# Patient Record
Sex: Female | Born: 1985 | Race: Black or African American | Hispanic: No | Marital: Single | Smoking: Never smoker
Health system: Southern US, Community
[De-identification: ages and names within clinical notes are randomized; demographics above are authoritative.]

## PROBLEM LIST (undated history)

## (undated) ENCOUNTER — Inpatient Hospital Stay (HOSPITAL_COMMUNITY): Payer: Self-pay

## (undated) DIAGNOSIS — K219 Gastro-esophageal reflux disease without esophagitis: Secondary | ICD-10-CM

## (undated) DIAGNOSIS — Z01419 Encounter for gynecological examination (general) (routine) without abnormal findings: Secondary | ICD-10-CM

## (undated) DIAGNOSIS — E282 Polycystic ovarian syndrome: Secondary | ICD-10-CM

## (undated) DIAGNOSIS — R1310 Dysphagia, unspecified: Secondary | ICD-10-CM

## (undated) DIAGNOSIS — A6000 Herpesviral infection of urogenital system, unspecified: Secondary | ICD-10-CM

## (undated) DIAGNOSIS — Z86718 Personal history of other venous thrombosis and embolism: Secondary | ICD-10-CM

## (undated) DIAGNOSIS — I10 Essential (primary) hypertension: Secondary | ICD-10-CM

## (undated) DIAGNOSIS — J302 Other seasonal allergic rhinitis: Secondary | ICD-10-CM

## (undated) DIAGNOSIS — B379 Candidiasis, unspecified: Secondary | ICD-10-CM

## (undated) DIAGNOSIS — A599 Trichomoniasis, unspecified: Secondary | ICD-10-CM

## (undated) DIAGNOSIS — N390 Urinary tract infection, site not specified: Secondary | ICD-10-CM

## (undated) HISTORY — DX: Encounter for gynecological examination (general) (routine) without abnormal findings: Z01.419

## (undated) HISTORY — DX: Candidiasis, unspecified: B37.9

## (undated) HISTORY — DX: Personal history of other venous thrombosis and embolism: Z86.718

## (undated) HISTORY — DX: Other seasonal allergic rhinitis: J30.2

## (undated) HISTORY — DX: Essential (primary) hypertension: I10

## (undated) HISTORY — DX: Herpesviral infection of urogenital system, unspecified: A60.00

## (undated) HISTORY — DX: Trichomoniasis, unspecified: A59.9

## (undated) HISTORY — DX: Gastro-esophageal reflux disease without esophagitis: K21.9

## (undated) HISTORY — DX: Dysphagia, unspecified: R13.10

## (undated) HISTORY — DX: Polycystic ovarian syndrome: E28.2

## (undated) HISTORY — PX: TONSILECTOMY, ADENOIDECTOMY, BILATERAL MYRINGOTOMY AND TUBES: SHX2538

---
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
## 2006-03-06 ENCOUNTER — Emergency Department (HOSPITAL_COMMUNITY): Admission: EM | Admit: 2006-03-06 | Discharge: 2006-03-06 | Payer: Self-pay | Admitting: Emergency Medicine

## 2006-11-30 ENCOUNTER — Emergency Department (HOSPITAL_COMMUNITY): Admission: EM | Admit: 2006-11-30 | Discharge: 2006-11-30 | Payer: Self-pay | Admitting: Emergency Medicine

## 2008-08-12 DIAGNOSIS — R1310 Dysphagia, unspecified: Secondary | ICD-10-CM

## 2008-08-12 HISTORY — DX: Dysphagia, unspecified: R13.10

## 2009-08-21 ENCOUNTER — Emergency Department (HOSPITAL_COMMUNITY): Admission: EM | Admit: 2009-08-21 | Discharge: 2009-08-21 | Payer: Self-pay | Admitting: Emergency Medicine

## 2012-01-08 ENCOUNTER — Other Ambulatory Visit: Payer: Self-pay | Admitting: Obstetrics and Gynecology

## 2012-01-08 ENCOUNTER — Ambulatory Visit (INDEPENDENT_AMBULATORY_CARE_PROVIDER_SITE_OTHER): Payer: BC Managed Care – PPO | Admitting: Obstetrics and Gynecology

## 2012-01-08 ENCOUNTER — Encounter: Payer: Self-pay | Admitting: Obstetrics and Gynecology

## 2012-01-08 VITALS — BP 122/66 | Ht 62.0 in | Wt 193.0 lb

## 2012-01-08 DIAGNOSIS — A499 Bacterial infection, unspecified: Secondary | ICD-10-CM

## 2012-01-08 DIAGNOSIS — B9689 Other specified bacterial agents as the cause of diseases classified elsewhere: Secondary | ICD-10-CM

## 2012-01-08 DIAGNOSIS — Z124 Encounter for screening for malignant neoplasm of cervix: Secondary | ICD-10-CM

## 2012-01-08 DIAGNOSIS — Z113 Encounter for screening for infections with a predominantly sexual mode of transmission: Secondary | ICD-10-CM

## 2012-01-08 DIAGNOSIS — Z9189 Other specified personal risk factors, not elsewhere classified: Secondary | ICD-10-CM

## 2012-01-08 DIAGNOSIS — Z202 Contact with and (suspected) exposure to infections with a predominantly sexual mode of transmission: Secondary | ICD-10-CM

## 2012-01-08 DIAGNOSIS — N760 Acute vaginitis: Secondary | ICD-10-CM

## 2012-01-08 DIAGNOSIS — Z01419 Encounter for gynecological examination (general) (routine) without abnormal findings: Secondary | ICD-10-CM

## 2012-01-08 DIAGNOSIS — E282 Polycystic ovarian syndrome: Secondary | ICD-10-CM

## 2012-01-08 LAB — POCT OSOM TRICHOMONAS RAPID TEST: Trichomonas vaginalis: NEGATIVE

## 2012-01-08 MED ORDER — ETONOGESTREL-ETHINYL ESTRADIOL 0.12-0.015 MG/24HR VA RING: VAGINAL_RING | VAGINAL | Status: DC

## 2012-01-08 MED ORDER — METRONIDAZOLE 0.75 % VA GEL: VAGINAL | Status: DC

## 2012-01-08 NOTE — Progress Notes (Signed)
The patient reports:she complains of vaginal odor. Also has PCOS wanting information on having children.   Contraception:condoms  Last mammogram: not applicable Last pap: approximate date 09/2010 and was normal   GC/Chlamydia cultures offered: requested HIV/RPR/HbsAg offered:  requested HSV 1 and 2 glycoprotein offered: requested  Menstrual cycle regular and monthly: Yes Menstrual flow normal: No: varies. Sometimes very heavy and painful, others light with no pain  Urinary symptoms: some burning. Requested Urine Check Normal bowel movements: Yes Reports abuse at home: No:  Subjective:    Allison Wagner is a 26 y.o. female, G0P0, who presents for an annual exam with history of PCOS, massive weight gain over the las 2 years, and concerns around fertility   History   Social History  . Marital Status: Single    Spouse Name: N/A    Number of Children: N/A  . Years of Education: N/A   Social History Main Topics  . Smoking status: Never Smoker   . Smokeless tobacco: Never Used  . Alcohol Use: Yes     occasional   . Drug Use: No  . Sexually Active: Yes    Birth Control/ Protection: Condom   Other Topics Concern  . None   Social History Narrative  . None    Menstrual cycle:   LMP: Patient's last menstrual period was 01/04/2012.           Cycle: Now fairly regular, monthly, lasting 5 days, with occassional premenstrual spotting for 2-3 days, and sever cramps  The following portions of the patient's history were reviewed and updated as appropriate: allergies, current medications, past family history, past medical history, past social history, past surgical history and problem list.  Review of Systems Pertinent items are noted in HPI. Breast:Negative for breast lump,nipple discharge or nipple retraction Gastrointestinal: Negative for abdominal pain, change in bowel habits or rectal bleeding Urinary:negative   Objective:    BP 122/66  Ht 5\' 2"  (1.575 m)  Wt 193 lb  (87.544 kg)  BMI 35.30 kg/m2  LMP 01/04/2012    Weight:  Wt Readings from Last 1 Encounters:  01/08/12 193 lb (87.544 kg)          BMI: Body mass index is 35.30 kg/(m^2).  General Appearance: Alert, appropriate appearance for age. No acute distress HEENT: Grossly normal Neck / Thyroid: Supple, no masses, nodes or enlargement Lungs: clear to auscultation bilaterally Back: No CVA tenderness Breast Exam: No masses or nodes.No dimpling, nipple retraction or discharge. Cardiovascular: Regular rate and rhythm. S1, S2, no murmur Gastrointestinal: Soft, non-tender, no masses or organomegaly Pelvic Exam: External genitalia: normal general appearance Vaginal: normal mucosa without prolapse or lesions Cervix: normal appearance Adnexa: non palpable Uterus: doesnt feel enlarged, but exam is compromised by patient habitus Rectovaginal: normal rectal, no masses Lymphatic Exam: Non-palpable nodes in neck, clavicular, axillary, or inguinal regions Skin: no rash or abnormalities Neurologic: Normal gait and speech, no tremor  Psychiatric: Alert and oriented, appropriate affect.  OSOM: BV: POSITIVE   TRICH: NEGATIVE     Assessment:    PCOS  BV Obesity   Plan:  Metrog[employer]  counseled on breast self exam, STD prevention, use and side effects of OCP's and family planning choices additional lab tests per orders:   Nutrition counseling offered and declined Exercise recommended for weight management Fertility issues discussed STD screening: done, written information provided Contraception:NuvaRing vaginal inserts reviewed for benefits for PCOS and use.  Pt inserted a sample at visit and was instructed in its use.  FU in 3mos   Allison Wagner PMD

## 2012-01-08 NOTE — Patient Instructions (Addendum)
Polycystic Ovarian Syndrome Polycystic ovarian syndrome is a condition with a number of problems. One problem is with the ovaries. The ovaries are organs located in the female pelvis, on each side of the uterus. Usually, during the menstrual cycle, an egg is released from 1 ovary every month. This is called ovulation. When the egg is fertilized, it goes into the womb (uterus), which allows for the growth of a baby. The egg travels from the ovary through the fallopian tube to the uterus. The ovaries also make the hormones estrogen and progesterone. These hormones help the development of a woman's breasts, body shape, and body hair. They also regulate the menstrual cycle and pregnancy. Sometimes, cysts form in the ovaries. A cyst is a fluid-filled sac. On the ovary, different types of cysts can form. The most common type of ovarian cyst is called a functional or ovulation cyst. It is normal, and often forms during the normal menstrual cycle. Each month, a woman's ovaries grow tiny cysts that hold the eggs. When an egg is fully grown, the sac breaks open. This releases the egg. Then, the sac which released the egg from the ovary dissolves. In one type of functional cyst, called a follicle cyst, the sac does not break open to release the egg. It may actually continue to grow. This type of cyst usually disappears within 1 to 3 months.  One type of cyst problem with the ovaries is called Polycystic Ovarian Syndrome (PCOS). In this condition, many follicle cysts form, but do not rupture and produce an egg. This health problem can affect the following:  Menstrual cycle.   Heart.   Obesity.   Cancer of the uterus.   Fertility.   Blood vessels.   Hair growth (face and body) or baldness.   Hormones.   Appearance.   High blood pressure.   Stroke.   Insulin production.   Inflammation of the liver.   Elevated blood cholesterol and triglycerides.  CAUSES   No one knows the exact cause of PCOS.    Women with PCOS often have a mother or sister with PCOS. There is not yet enough proof to say this is inherited.   Many women with PCOS have a weight problem.   Researchers are looking at the relationship between PCOS and the body's ability to make insulin. Insulin is a hormone that regulates the change of sugar, starches, and other food into energy for the body's use, or for storage. Some women with PCOS make too much insulin. It is possible that the ovaries react by making too many female hormones, called androgens. This can lead to acne, excessive hair growth, weight gain, and ovulation problems.   Too much production of luteinizing hormone (LH) from the pituitary gland in the brain stimulates the ovary to produce too much female hormone (androgen).  SYMPTOMS   Infrequent or no menstrual periods, and/or irregular bleeding.   Inability to get pregnant (infertility), because of not ovulating.   Increased growth of hair on the face, chest, stomach, back, thumbs, thighs, or toes.   Acne, oily skin, or dandruff.   Pelvic pain.   Weight gain or obesity, usually carrying extra weight around the waist.   Type 2 diabetes (this is the diabetes that usually does not need insulin).   High cholesterol.   High blood pressure.   Female-pattern baldness or thinning hair.   Patches of thickened and dark brown or black skin on the neck, arms, breasts, or thighs.   Skin tags,   or tiny excess flaps of skin, in the armpits or neck area.   Sleep apnea (excessive snoring and breathing stops at times while asleep).   Deepening of the voice.   Gestational diabetes when pregnant.   Increased risk of miscarriage with pregnancy.  DIAGNOSIS  There is no single test to diagnose PCOS.   Your caregiver will:   Take a medical history.   Perform a pelvic exam.   Perform an ultrasound.   Check your female and female hormone levels.   Measure glucose or sugar levels in the blood.   Do other blood  tests.   If you are producing too many female hormones, your caregiver will make sure it is from PCOS. At the physical exam, your caregiver will want to evaluate the areas of increased hair growth. Try to allow natural hair growth for a few days before the visit.   During a pelvic exam, the ovaries may be enlarged or swollen by the increased number of small cysts. This can be seen more easily by vaginal ultrasound or screening, to examine the ovaries and lining of the uterus (endometrium) for cysts. The uterine lining may become thicker, if there has not been a regular period.  TREATMENT  Because there is no cure for PCOS, it needs to be managed to prevent problems. Treatments are based on your symptoms. Treatment is also based on whether you want to have a baby or whether you need contraception.  Treatment may include:  Progesterone hormone, to start a menstrual period.   Birth control pills, to make you have regular menstrual periods.   Medicines to make you ovulate, if you want to get pregnant.   Medicines to control your insulin.   Medicine to control your blood pressure.   Medicine and diet, to control your high cholesterol and triglycerides in your blood.   Surgery, making small holes in the ovary, to decrease the amount of female hormone production. This is done through a long, lighted tube (laparoscope), placed into the pelvis through a tiny incision in the lower abdomen.  Your caregiver will go over some of the choices with you. WOMEN WITH PCOS HAVE THESE CHARACTERISTICS:  High levels of female hormones called androgens.   An irregular or no menstrual cycle.   May have many small cysts in their ovaries.  PCOS is the most common hormonal reproductive problem in women of childbearing age. WHY DO WOMEN WITH PCOS HAVE TROUBLE WITH THEIR MENSTRUAL CYCLE? Each month, about 20 eggs start to mature in the ovaries. As one egg grows and matures, the follicle breaks open to release the egg,  so it can travel through the fallopian tube for fertilization. When the single egg leaves the follicle, ovulation takes place. In women with PCOS, the ovary does not make all of the hormones it needs for any of the eggs to fully mature. They may start to grow and accumulate fluid, but no one egg becomes large enough. Instead, some may remain as cysts. Since no egg matures or is released, ovulation does not occur and the hormone progesterone is not made. Without progesterone, a woman's menstrual cycle is irregular or absent. Also, the cysts produce female hormones, which continue to prevent ovulation.  Document Released: 11/22/2004 Document Revised: 07/18/2011 Document Reviewed: 06/16/2009 [facility] Patient Information 2012 [place], [place].  Bacterial Vaginosis Bacterial vaginosis (BV) is a vaginal infection where the normal balance of bacteria in the vagina is disrupted. The normal balance is then replaced by an overgrowth of  certain bacteria. There are several different kinds of bacteria that can cause BV. BV is the most common vaginal infection in women of childbearing age. CAUSES   The cause of BV is not fully understood. BV develops when there is an increase or imbalance of harmful bacteria.   Some activities or behaviors can upset the normal balance of bacteria in the vagina and put women at increased risk including:   Having a new sex partner or multiple sex partners.   Douching.   Using an intrauterine device (IUD) for contraception.   It is not clear what role sexual activity plays in the development of BV. However, women that have never had sexual intercourse are rarely infected with BV.  Women do not get BV from toilet seats, bedding, swimming pools or from touching objects around them.  SYMPTOMS   Grey vaginal discharge.   A fish-like odor with discharge, especially after sexual intercourse.   Itching or burning of the vagina and vulva.   Burning or pain with urination.   Some  women have no signs or symptoms at all.  DIAGNOSIS  Your caregiver must examine the vagina for signs of BV. Your caregiver will perform lab tests and look at the sample of vaginal fluid through a microscope. They will look for bacteria and abnormal cells (clue cells), a pH test higher than 4.5, and a positive amine test all associated with BV.  RISKS AND COMPLICATIONS   Pelvic inflammatory disease (PID).   Infections following gynecology surgery.   Developing HIV.   Developing herpes virus.  TREATMENT  Sometimes BV will clear up without treatment. However, all women with symptoms of BV should be treated to avoid complications, especially if gynecology surgery is planned. Female partners generally do not need to be treated. However, BV may spread between female sex partners so treatment is helpful in preventing a recurrence of BV.   BV may be treated with antibiotics. The antibiotics come in either pill or vaginal cream forms. Either can be used with nonpregnant or pregnant women, but the recommended dosages differ. These antibiotics are not harmful to the baby.   BV can recur after treatment. If this happens, a second round of antibiotics will often be prescribed.   Treatment is important for pregnant women. If not treated, BV can cause a premature delivery, especially for a pregnant woman who had a premature birth in the past. All pregnant women who have symptoms of BV should be checked and treated.   For chronic reoccurrence of BV, treatment with a type of prescribed gel vaginally twice a week is helpful.  HOME CARE INSTRUCTIONS   Finish all medication as directed by your caregiver.   Do not have sex until treatment is completed.   Tell your sexual partner that you have a vaginal infection. They should see their caregiver and be treated if they have problems, such as a mild rash or itching.   Practice safe sex. Use condoms. Only have 1 sex partner.  PREVENTION  Basic prevention steps  can help reduce the risk of upsetting the natural balance of bacteria in the vagina and developing BV:  Do not have sexual intercourse (be abstinent).   Do not douche.   Use all of the medicine prescribed for treatment of BV, even if the signs and symptoms go away.   Tell your sex partner if you have BV. That way, they can be treated, if needed, to prevent reoccurrence.  SEEK MEDICAL CARE IF:   Your  symptoms are not improving after 3 days of treatment.   You have increased discharge, pain, or fever.  MAKE SURE YOU:   Understand these instructions.   Will watch your condition.   Will get help right away if you are not doing well or get worse.  FOR MORE INFORMATION  Division of STD Prevention (DSTDP), Centers for Disease Control and Prevention: [employer] American Social Health Association (ASHA): www.ashastd.org  Document Released: 07/29/2005 Document Revised: 07/18/2011 Document Reviewed: 01/19/2009 [facility] Patient Information 2012 [place], [place].

## 2012-01-09 LAB — GC/CHLAMYDIA PROBE AMP, GENITAL
Chlamydia, DNA Probe: NEGATIVE
GC Probe Amp, Genital: NEGATIVE

## 2012-01-09 LAB — RPR

## 2012-01-14 ENCOUNTER — Telehealth: Payer: Self-pay

## 2012-01-14 NOTE — Telephone Encounter (Signed)
Tc from pt. 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 Results explained to pt in detail. Offered pt an appt to discuss if needed and pt opts to call back if appt needed. Pt voices understanding.

## 2012-01-14 NOTE — Telephone Encounter (Signed)
Lm on vm to cb per test results.  

## 2012-04-17 ENCOUNTER — Other Ambulatory Visit: Payer: Self-pay | Admitting: Obstetrics and Gynecology

## 2012-04-17 NOTE — Telephone Encounter (Signed)
Chandra/rx req.

## 2012-04-17 NOTE — Telephone Encounter (Signed)
Tc to pt per telephone call. Pt states,"needs rf for Nuvaring". Informed pt Nuvaring e-pres to pharm on file in 12/2011. Pt states," was unaware rx was called into pharm". Pt removed last Nuvaring on 03/28/12 and has a cycle on 04/03/12. Pt denies unprotected IC since last cycle. Informed pt to await menses for this month and take a pregnancy test. If negative, pt to insert ring on that day. Informed pt to use BUM of contraception x 1 month after ring insertion. Pt voices understanding. FU appt for birth control sched 05/18/12 with vph. Pt voices understanding.

## 2012-05-18 ENCOUNTER — Ambulatory Visit (INDEPENDENT_AMBULATORY_CARE_PROVIDER_SITE_OTHER): Payer: BC Managed Care – PPO | Admitting: Obstetrics and Gynecology

## 2012-05-18 ENCOUNTER — Encounter: Payer: Self-pay | Admitting: Obstetrics and Gynecology

## 2012-05-18 VITALS — BP 112/74 | Wt 199.0 lb

## 2012-05-18 DIAGNOSIS — E282 Polycystic ovarian syndrome: Secondary | ICD-10-CM

## 2012-05-18 DIAGNOSIS — N926 Irregular menstruation, unspecified: Secondary | ICD-10-CM

## 2012-05-18 DIAGNOSIS — N915 Oligomenorrhea, unspecified: Secondary | ICD-10-CM

## 2012-05-18 DIAGNOSIS — N949 Unspecified condition associated with female genital organs and menstrual cycle: Secondary | ICD-10-CM

## 2012-05-18 DIAGNOSIS — Z8249 Family history of ischemic heart disease and other diseases of the circulatory system: Secondary | ICD-10-CM

## 2012-05-18 MED ORDER — VALACYCLOVIR HCL 500 MG PO TABS: 500.0000 mg | ORAL_TABLET | Freq: Every day | ORAL | Status: DC

## 2012-05-18 MED ORDER — MEDROXYPROGESTERONE ACETATE 5 MG PO TABS: ORAL_TABLET | ORAL | Status: DC

## 2012-05-18 NOTE — Progress Notes (Signed)
F/U GYN PROBLEM VISIT  [name] is a 26 y.o. year old female,G0P0, who presents for f/u of a problem visit.   Subjective: 1)Pt here for follow up of Nuvaring. Pt states," d/c Nuvaring  due to not knowing refills were available. Pt denies unprotected IC after d/c of Nuvaring. LMP 04/03/12 after removal of the last Nuvaring.  Mild cramping relieved by OTC Motrin.  No IM bleeding.  Busy schedule and "eating better" but not well scheduled. 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  She denies any knowledge of further outbreaks, but asks whether there is any intervention that will decrease her chance of passing HSV to a partner. 3) pt concerned that she has improved her diet but has gained weight.  She admits her schedule makes it hard to exercise.  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   Allison Forth, MD  05/18/2012 9:14 AM

## 2012-05-18 NOTE — Patient Instructions (Signed)
Genital Herpes  Genital herpes is a sexually transmitted disease. This means that it is a disease passed by having sex with an infected person. There is no cure for genital herpes. The time between attacks can be months to years. The virus may live in a person but produce no problems (symptoms). This infection can be passed to a baby as it travels down the birth canal (vagina). In a newborn, this can cause central nervous system damage, eye damage, or even death. The virus that causes genital herpes is usually HSV-2 virus. The virus that causes oral herpes is usually HSV-1. The diagnosis (learning what is wrong) is made through culture results.  SYMPTOMS   Usually symptoms of pain and itching begin a few days to a week after contact. It first appears as small blisters that progress to small painful ulcers which then scab over and heal after several days. It affects the outer genitalia, birth canal, cervix, penis, anal area, buttocks, and thighs.  HOME CARE INSTRUCTIONS    Keep ulcerated areas dry and clean.   Take medications as directed. Antiviral medications can speed up healing. They will not prevent recurrences or cure this infection. These medications can also be taken for suppression if there are frequent recurrences.   While the infection is active, it is contagious. Avoid all sexual contact during active infections.   Condoms may help prevent spread of the herpes virus.   Practice safe sex.   Wash your hands thoroughly after touching the genital area.   Avoid touching your eyes after touching your genital area.   Inform your caregiver if you have had genital herpes and become pregnant. It is your responsibility to insure a safe outcome for your baby in this pregnancy.   Only take over-the-counter or prescription medicines for pain, discomfort, or fever as directed by your caregiver.  SEEK MEDICAL CARE IF:    You have a recurrence of this infection.   You do not respond to medications and are not  improving.   You have new sources of pain or discharge which have changed from the original infection.   You have an oral temperature above 102 F (38.9 C).   You develop abdominal pain.   You develop eye pain or signs of eye infection.  Document Released: 07/26/2000 Document Revised: 10/21/2011 Document Reviewed: 08/16/2009  [employer] Patient Information 2013 [employer].

## 2012-06-01 ENCOUNTER — Encounter: Payer: Self-pay | Admitting: *Deleted

## 2012-06-01 ENCOUNTER — Encounter: Payer: BC Managed Care – PPO | Attending: Obstetrics and Gynecology | Admitting: *Deleted

## 2012-06-01 VITALS — Ht 62.0 in | Wt 197.4 lb

## 2012-06-01 DIAGNOSIS — E282 Polycystic ovarian syndrome: Secondary | ICD-10-CM

## 2012-06-01 DIAGNOSIS — Z713 Dietary counseling and surveillance: Secondary | ICD-10-CM | POA: Insufficient documentation

## 2012-06-01 DIAGNOSIS — E669 Obesity, unspecified: Secondary | ICD-10-CM | POA: Insufficient documentation

## 2012-06-01 NOTE — Progress Notes (Signed)
Medical Nutrition Therapy:  Appt start time: 0800 end time:  0900.  Assessment:  Primary concerns today: Weight managment. Patient reports that she has gained about 65-70 pounds in the last 2 years. She has a history of PCOS. She reports that she does not eat much, but does not exercise as much as she would like.   WEIGHT: 197.4 pounds BMI: 36.2  MEDICATIONS: Multivitamin, biotin, B12   DIETARY INTAKE:   Usual eating pattern includes 1-2 meals and 0 snacks per day.  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   Nutritional Diagnosis:  New Alluwe-3.3 Overweight/obesity As related to history of excessive energy intake and physical inactivity.  As evidenced by BMI 36.2.    Intervention:  Nutrition counseling. We discussed strategies for weight loss, including portion control, label reading, and menu planning for a balanced diet.   Goals:  1. 1200 calories for 1 pound weight loss per week 2. Eat 3 meals daily, include small breakfast of granola bar, fruit, yogurt.  3. Exercise at the gym 3 days weekly for 45 minutes (elliptical, aerobics class) and weights 2 days weekly.  4. Monitor portion size  Handouts given during visit include:  Weight loss tips  5 day 1200 calorie meal plan  Monitoring/Evaluation:  Dietary intake, exercise, and body weight in 1 month(s).

## 2012-06-01 NOTE — Patient Instructions (Signed)
Goals:  1. 1200 calories for 1 pound weight loss per week 2. Eat 3 meals daily, include small breakfast of granola bar, fruit, yogurt.  3. Exercise at the gym 3 days weekly for 45 minutes (elliptical, aerobics class) and weights 2 days weekly.  4. Monitor portion size

## 2012-07-06 ENCOUNTER — Ambulatory Visit: Payer: BC Managed Care – PPO | Admitting: *Deleted

## 2012-09-30 ENCOUNTER — Encounter: Payer: Self-pay | Admitting: Obstetrics and Gynecology

## 2012-09-30 ENCOUNTER — Ambulatory Visit: Payer: BC Managed Care – PPO | Admitting: Obstetrics and Gynecology

## 2012-09-30 VITALS — BP 120/84 | HR 80 | Ht 62.0 in | Wt 194.0 lb

## 2012-09-30 DIAGNOSIS — E282 Polycystic ovarian syndrome: Secondary | ICD-10-CM

## 2012-09-30 DIAGNOSIS — N898 Other specified noninflammatory disorders of vagina: Secondary | ICD-10-CM

## 2012-09-30 LAB — POCT WET PREP (WET MOUNT): Clue Cells Wet Prep Whiff POC: NEGATIVE

## 2012-09-30 MED ORDER — ETONOGESTREL-ETHINYL ESTRADIOL 0.12-0.015 MG/24HR VA RING: VAGINAL_RING | VAGINAL | Status: DC

## 2012-09-30 MED ORDER — VALACYCLOVIR HCL 500 MG PO TABS: 500.0000 mg | ORAL_TABLET | Freq: Every day | ORAL | Status: DC

## 2012-09-30 NOTE — Progress Notes (Signed)
Subjective:  Last Pap: 2 yrs ago per pt WNL: Yes  Regular Periods:yes Contraception: nuvaring   Monthly Breast exam:yes Tetanus<21yrs:no Nl.Bladder Function:yes Daily BMs:yes Healthy Diet:yes Calcium:no Mammogram:no Date of Mammogram: n/a Exercise:yes Have often Exercise: 3 times per week  Seatbelt: yes Abuse at home: no Stressful work:yes Sigmoid-colonoscopy: n/a Bone Density: No PCP: n/a Change in PMH: no changes Change in FMH:no changes   Pt c/o vaginal discharge.  Allison Wagner is a 27 y.o. female, G0P0, who presents for an annual exam. Some vaginal discharge after menses with itching and burning.  Has used Vagisil for pH balance for an external.  No dyspareunia.  No sx at this time    History   Social History  . Marital Status: Single    Spouse Name: N/A    Number of Children: N/A  . Years of Education: N/A   Social History Main Topics  . Smoking status: Never Smoker   . Smokeless tobacco: Never Used  . Alcohol Use: Yes     Comment: occasional   . Drug Use: No  . Sexually Active: Yes    Birth Control/ Protection: Inserts     Comment: nuva ring    Other Topics Concern  . None   Social History Narrative  . None    Menstrual cycle:   LMP: Patient's last menstrual period was 09/09/2012.           Cycle: Regular montly with Nuvaring.  No IM bleeding, minimal cramping prior to removal of ring  The following portions of the patient's history were reviewed and updated as appropriate: allergies, current medications, past family history, past medical history, past social history, past surgical history and problem list.  Review of Systems Pertinent items are noted in HPI. Breast:Negative for breast lump,nipple discharge or nipple retraction Gastrointestinal: Negative for abdominal pain, change in bowel habits or rectal bleeding Urinary:negative   Objective:    BP 120/84  Pulse 80  Ht 5\' 2"  (1.575 m)  Wt 194 lb (87.998 kg)  BMI 35.47 kg/m2  LMP  09/09/2012    Weight:  Wt Readings from Last 1 Encounters:  09/30/12 194 lb (87.998 kg)          BMI: Body mass index is 35.47 kg/(m^2).  General Appearance: Alert, appropriate appearance for age. No acute distress HEENT: Grossly normal Neck / Thyroid: Supple, no masses, nodes or enlargement Lungs: clear to auscultation bilaterally Back: No CVA tenderness Breast Exam: No masses or nodes.No dimpling, nipple retraction or discharge. Cardiovascular: Regular rate and rhythm. S1, S2, no murmur Gastrointestinal: Soft, non-tender, no masses or organomegaly Pelvic Exam: External genitalia: normal general appearance Vaginal: normal mucosa without prolapse or lesions and discharge, white Cervix: normal appearance Adnexa: no masses noted Uterus: normal single, nontender Exam limited by body habitus Rectovaginal: normal rectal, no masses Lymphatic Exam: Non-palpable nodes in neck, clavicular, axillary, or inguinal regions Skin: no rash or abnormalities Neurologic: Normal gait and speech, no tremor  Psychiatric: Alert and oriented, appropriate affect.   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

## 2012-10-13 ENCOUNTER — Telehealth: Payer: Self-pay

## 2012-10-13 NOTE — Telephone Encounter (Signed)
Lm on vm to cb per vm rgdg test results.

## 2012-10-13 NOTE — Telephone Encounter (Signed)
Tc from pt. Informed pt GC/CT-all negative. Wet prep-neg. Pt voices understanding.

## 2013-01-12 ENCOUNTER — Ambulatory Visit (INDEPENDENT_AMBULATORY_CARE_PROVIDER_SITE_OTHER): Payer: BC Managed Care – PPO | Admitting: Medical

## 2013-01-12 ENCOUNTER — Encounter: Payer: Self-pay | Admitting: Medical

## 2013-01-12 VITALS — BP 130/90 | HR 80 | Temp 98.8°F | Resp 16 | Ht 62.5 in | Wt 195.0 lb

## 2013-01-12 DIAGNOSIS — K219 Gastro-esophageal reflux disease without esophagitis: Secondary | ICD-10-CM

## 2013-01-12 DIAGNOSIS — J45909 Unspecified asthma, uncomplicated: Secondary | ICD-10-CM

## 2013-01-12 MED ORDER — ALBUTEROL SULFATE HFA 108 (90 BASE) MCG/ACT IN AERS: 2.0000 | INHALATION_SPRAY | Freq: Four times a day (QID) | RESPIRATORY_TRACT | Status: DC | PRN

## 2013-01-12 MED ORDER — RANITIDINE HCL 150 MG PO TABS: 150.0000 mg | ORAL_TABLET | Freq: Two times a day (BID) | ORAL | Status: DC

## 2013-01-12 NOTE — Progress Notes (Signed)
Subjective:  Allison Wagner is a 27 y.o. female who presents as a new patient.     She notes hx/o GERD.  Lately been nauseated, heartburn, feels like acid coming up in the esophagus.  Not using anything currently.   Constantly feels like she is having to clear her through.  In the past used Ranitidine prescription.   She tries to avoid the trigger foods.    She notes hx/o asthma, albuterol inhaler is outdated.  Diagnosed age 22yo.  Currently gets bouts of cough or sometimes gets tight chest with hot weather or exercise.  She has outdated inhaler, feels like she needs to use inhaler 2 times per month.  Pollen does worsen her symptoms.  Currently using zyrtec for allergies.      Sees gynecology for routine paps, hx/o genital herpes, on valtrex preventative.  No other c/o.  The following portions of the patient's history were reviewed and updated as appropriate: allergies, current medications, past family history, past medical history, past social history, past surgical history and problem list.  Allergies  Allergen Reactions  . Codeine Other (See Comments)    unsure  . Hydrocodone Other (See Comments)    h/a    Current Outpatient Prescriptions on File Prior to Visit  Medication Sig Dispense Refill  . cetirizine (ZYRTEC) 10 MG tablet Take 10 mg by mouth daily.      Marland Kitchen etonogestrel-ethinyl estradiol (NUVARING) 0.12-0.015 MG/24HR vaginal ring Insert vaginally and leave in place for 3 consecutive weeks, then remove for 1 week.  1 each  12  . valACYclovir (VALTREX) 500 MG tablet Take 1 tablet (500 mg total) by mouth daily.  30 tablet  12  . BIOTIN PO Take 1 tablet by mouth daily.      . Cyanocobalamin (VITAMIN B 12 PO) Take 1 tablet by mouth daily.      . medroxyPROGESTERone (PROVERA) 5 MG tablet Pt to take 1 tablet by mouth x 5days  5 tablet  3   No current facility-administered medications on file prior to visit.    Past Medical History  Diagnosis Date  . Yeast infection   . Trichomonas    . Asthma   . Hx of blood clots     vaginal with menstruation  . Polycystic ovarian syndrome 1995  . GERD (gastroesophageal reflux disease)   . Routine gynecological examination     Dr. Pennie Rushing  . Herpes genitalia     type 1    Past Surgical History  Procedure Laterality Date  . Tonsilectomy, adenoidectomy, bilateral myringotomy and tubes      Family History  Problem Relation Age of Onset  . Hypertension Mother   . Migraines Mother   . Migraines Brother     History   Social History  . Marital Status: Single    Spouse Name: N/A    Number of Children: N/A  . Years of Education: N/A   Occupational History  . Not on file.   Social History Main Topics  . Smoking status: Never Smoker   . Smokeless tobacco: Never Used  . Alcohol Use: Yes     Comment: occasional   . Drug Use: No  . Sexually Active: Yes    Birth Control/ Protection: Inserts     Comment: nuva ring    Other Topics Concern  . Not on file   Social History Narrative  . No narrative on file    Reviewed their medical, surgical, family, social, medication, and allergy history and updated  chart as appropriate.   ROS Otherwise as in subjective above  Objective: Physical Exam  Vital signs reviewed  General appearance: alert, no distress, WD/WN HEENT: normocephalic, sclerae anicteric, conjunctiva pink and moist, TMs pearly, nares patent, no discharge or erythema, pharynx normal Oral cavity: MMM, no lesions Neck: supple, no lymphadenopathy, no thyromegaly, no masses Heart: RRR, normal S1, S2, no murmurs Lungs: CTA bilaterally, no wheezes, rhonchi, or rales Abdomen: +bs, soft, non tender, non distended, no masses, no hepatomegaly, no splenomegaly Pulses: 2+ radial pulses, 2+ pedal pulses, normal cap refill Ext: no edema   Assessment: Encounter Diagnoses  Name Primary?  Marland Kitchen Unspecified asthma(493.90) Yes  . GERD (gastroesophageal reflux disease)     Plan: Discussed her concerns.   She has mild  intermittent asthma.  Discussed trigger avoidance, proper use of medications, when to return for flare up or need for controller medication.   Refilled albuterol.   Discussed GERD triggers, avoidance, and begin back on Ranitidine BID.   Follow up: 66mo for recheck and physical.

## 2013-01-12 NOTE — Patient Instructions (Signed)
Asthma Attack Prevention HOW CAN ASTHMA BE PREVENTED? Currently, there is no way to prevent asthma from starting. However, you can take steps to control the disease and prevent its symptoms after you have been diagnosed. Learn about your asthma and how to control it. Take an active role to control your asthma by working with your caregiver to create and follow an asthma action plan. An asthma action plan guides you in taking your medicines properly, avoiding factors that make your asthma worse, tracking your level of asthma control, responding to worsening asthma, and seeking emergency care when needed. To track your asthma, keep records of your symptoms, check your peak flow number using a peak flow meter (handheld device that shows how well air moves out of your lungs), and get regular asthma checkups.  Other ways to prevent asthma attacks include:  Use medicines as your caregiver directs.  Identify and avoid things that make your asthma worse (as much as you can).  Keep track of your asthma symptoms and level of control.  Get regular checkups for your asthma.  With your caregiver, write a detailed plan for taking medicines and managing an asthma attack. Then be sure to follow your action plan. Asthma is an ongoing condition that needs regular monitoring and treatment.  Identify and avoid asthma triggers. A number of outdoor allergens and irritants (pollen, mold, cold air, air pollution) can trigger asthma attacks. Find out what causes or makes your asthma worse, and take steps to avoid those triggers.  Monitor your breathing. Learn to recognize warning signs of an attack, such as slight coughing, wheezing or shortness of breath. However, your lung function may already decrease before you notice any signs or symptoms, so regularly measure and record your peak airflow with a home peak flow meter.  Identify and treat attacks early. If you act quickly, you're less likely to have a severe attack.  You will also need less medicine to control your symptoms. When your peak flow measurements decrease and alert you to an upcoming attack, take your medicine as instructed, and immediately stop any activity that may have triggered the attack. If your symptoms do not improve, get medical help.  Pay attention to increasing quick-relief inhaler use. If you find yourself relying on your quick-relief inhaler (such as albuterol), your asthma is not under control. See your caregiver about adjusting your treatment. IDENTIFY AND CONTROL FACTORS THAT MAKE YOUR ASTHMA WORSE A number of common things can set off or make your asthma symptoms worse (asthma triggers). Keep track of your asthma symptoms for several weeks, detailing all the environmental and emotional factors that are linked with your asthma. When you have an asthma attack, go back to your asthma diary to see which factor, or combination of factors, might have contributed to it. Once you know what these factors are, you can take steps to control many of them.  Allergies: If you have allergies and asthma, it is important to take asthma prevention steps at home. Asthma attacks (worsening of asthma symptoms) can be triggered by allergies, which can cause temporary increased inflammation of your airways. Minimizing contact with the substance to which you are allergic will help prevent an asthma attack. Animal Dander:   Some people are allergic to the flakes of skin or dried saliva from animals with fur or feathers. Keep these pets out of your home.  If you can't keep a pet outdoors, keep the pet out of your bedroom and other sleeping areas at all times,  and keep the door closed.  Remove carpets and furniture covered with cloth from your home. If that is not possible, keep the pet away from fabric-covered furniture and carpets. Dust Mites:  Many people with asthma are allergic to dust mites. Dust mites are tiny bugs that are found in every home, in  mattresses, pillows, carpets, fabric-covered furniture, bedcovers, clothes, stuffed toys, fabric, and other fabric-covered items.  Cover your mattress in a special dust-proof cover.  Cover your pillow in a special dust-proof cover, or wash the pillow each week in hot water. Water must be hotter than 130 F to kill dust mites. Cold or warm water used with detergent and bleach can also be effective.  Wash the sheets and blankets on your bed each week in hot water.  Try not to sleep or lie on cloth-covered cushions.  Call ahead when traveling and ask for a smoke-free hotel room. Bring your own bedding and pillows, in case the hotel only supplies feather pillows and down comforters, which may contain dust mites and cause asthma symptoms.  Remove carpets from your bedroom and those laid on concrete, if you can.  Keep stuffed toys out of the bed, or wash the toys weekly in hot water or cooler water with detergent and bleach. Cockroaches:  Many people with asthma are allergic to the droppings and remains of cockroaches.  Keep food and garbage in closed containers. Never leave food out.  Use poison baits, traps, powders, gels, or paste (for example, boric acid).  If a spray is used to kill cockroaches, stay out of the room until the odor goes away. Indoor Mold:  Fix leaky faucets, pipes, or other sources of water that have mold around them.  Clean floors and moldy surfaces with a fungicide or diluted bleach.  Avoid using humidifiers, vaporizers, or swamp coolers. These can spread molds through the air. Pollen and Outdoor Mold:  When pollen or mold spore counts are high, try to keep your windows closed.  Stay indoors with windows closed from late morning to afternoon, if you can. Pollen and some mold spore counts are highest at that time.  Ask your caregiver whether you need to take or increase anti-inflammatory medicine before your allergy season starts. Irritants:   Tobacco smoke is  an irritant. If you smoke, ask your caregiver how you can quit. Ask family members to quit smoking, too. Do not allow smoking in your home or car.  If possible, do not use a wood-burning stove, kerosene heater, or fireplace. Minimize exposure to all sources of smoke, including incense, candles, fires, and fireworks.  Try to stay away from strong odors and sprays, such as perfume, talcum powder, hair spray, and paints.  Decrease humidity in your home and use an indoor air cleaning device. Reduce indoor humidity to below 60 percent. Dehumidifiers or central air conditioners can do this.  Decrease house dust exposure by changing furnace and air cooler filters frequently.  Try to have someone else vacuum for you once or twice a week, if you can. Stay out of rooms while they are being vacuumed and for a short while afterward.  If you vacuum, use a dust mask from a hardware store, a double-layered or microfilter vacuum cleaner bag, or a vacuum cleaner with a HEPA filter.  Sulfites in foods and beverages can be irritants. Do not drink beer or wine, or eat dried fruit, processed potatoes, or shrimp if they cause asthma symptoms.  Cold air can trigger an asthma attack.  Cover your nose and mouth with a scarf on cold or windy days.  Several health conditions can make asthma more difficult to manage, including runny nose, sinus infections, reflux disease, psychological stress, and sleep apnea. Your caregiver will treat these conditions, as well.  Avoid close contact with people who have a cold or the flu, since your asthma symptoms may get worse if you catch the infection from them. Wash your hands thoroughly after touching items that may have been handled by people with a respiratory infection.  Get a flu shot every year to protect against the flu virus, which often makes asthma worse for days or weeks. Also get a pneumonia shot once every 5 10 years. Medicines:  Aspirin and other pain relievers can  cause asthma attacks. Ten percent to 20% of people with asthma have sensitivity to aspirin or a group of pain relievers called non-steroidal anti-inflammatory medicines (NSAIDS), such as ibuprofen and naproxen. These medicines are used to treat pain and reduce fevers. Asthma attacks caused by any of these medicines can be severe and even fatal. These medicines must be avoided in people who have known aspirin sensitive asthma. Products with acetaminophen are considered safe for people who have asthma. It is important that people with aspirin sensitivity read labels of all over-the-counter medicines used to treat pain, colds, coughs, and fever.  Beta blockers and ACE inhibitors are other medicines which you should discuss with your caregiver, in relation to your asthma. ALLERGY SKIN TESTING  Ask your asthma caregiver about allergy skin testing or blood testing (RAST test) to identify the allergens to which you are sensitive. If you are found to have allergies, allergy shots (immunotherapy) for asthma may help prevent future allergies and asthma. With allergy shots, small doses of allergens (substances to which you are allergic) are injected under your skin on a regular schedule. Over a period of time, your body may become used to the allergen and less responsive with asthma symptoms. You can also take measures to minimize your exposure to those allergens. EXERCISE  If you have exercise-induced asthma, or are planning vigorous exercise, or exercise in cold, humid, or dry environments, prevent exercise-induced asthma by following your caregiver's advice regarding asthma treatment before exercising. Document Released: 07/17/2009 Document Revised: 07/15/2012 Document Reviewed: 07/17/2009 [facility] Patient Information 2014 [place], [place].    Gastroesophageal Reflux Disease, Adult Gastroesophageal reflux disease (GERD) happens when acid from your stomach flows up into the esophagus. When acid comes in contact  with the esophagus, the acid causes soreness (inflammation) in the esophagus. Over time, GERD may create small holes (ulcers) in the lining of the esophagus. CAUSES   Increased body weight. This puts pressure on the stomach, making acid rise from the stomach into the esophagus.   Smoking. This increases acid production in the stomach.   Drinking alcohol. This causes decreased pressure in the lower esophageal sphincter (valve or ring of muscle between the esophagus and stomach), allowing acid from the stomach into the esophagus.   Late evening meals and a full stomach. This increases pressure and acid production in the stomach.   A malformed lower esophageal sphincter.  Sometimes, no cause is found. SYMPTOMS   Burning pain in the lower part of the mid-chest behind the breastbone and in the mid-stomach area. This may occur twice a week or more often.   Trouble swallowing.   Sore throat.   Dry cough.   Asthma-like symptoms including chest tightness, shortness of breath, or wheezing.  DIAGNOSIS  Your caregiver may be able to diagnose GERD based on your symptoms. In some cases, X-rays and other tests may be done to check for complications or to check the condition of your stomach and esophagus. TREATMENT  Your caregiver may recommend over-the-counter or prescription medicines to help decrease acid production. Ask your caregiver before starting or adding any new medicines.  HOME CARE INSTRUCTIONS   Change the factors that you can control. Ask your caregiver for guidance concerning weight loss, quitting smoking, and alcohol consumption.   Avoid foods and drinks that make your symptoms worse, such as:   Caffeine or alcoholic drinks.   Chocolate.   Peppermint or mint flavorings.   Garlic and onions.   Spicy foods.   Citrus fruits, such as oranges, lemons, or limes.   Tomato-based foods such as sauce, chili, salsa, and pizza.   Fried and fatty foods.   Avoid lying down for the  3 hours prior to your bedtime or prior to taking a nap.   Eat small, frequent meals instead of large meals.   Wear loose-fitting clothing. Do not wear anything tight around your waist that causes pressure on your stomach.   Raise the head of your bed 6 to 8 inches with wood blocks to help you sleep. Extra pillows will not help.   Only take over-the-counter or prescription medicines for pain, discomfort, or fever as directed by your caregiver.   Do not take aspirin, ibuprofen, or other nonsteroidal anti-inflammatory drugs (NSAIDs).  SEEK IMMEDIATE MEDICAL CARE IF:   You have pain in your arms, neck, jaw, teeth, or back.   Your pain increases or changes in intensity or duration.   You develop nausea, vomiting, or sweating (diaphoresis).   You develop shortness of breath, or you faint.   Your vomit is green, yellow, black, or looks like coffee grounds or blood.   Your stool is red, bloody, or black.  These symptoms could be signs of other problems, such as heart disease, gastric bleeding, or esophageal bleeding. MAKE SURE YOU:   Understand these instructions.   Will watch your condition.   Will get help right away if you are not doing well or get worse.  Document Released: 05/08/2005 Document Revised: 04/10/2011 Document Reviewed: 02/15/2011 [facility] Patient Information 2012 [place], [place].

## 2013-01-26 ENCOUNTER — Encounter: Payer: Self-pay | Admitting: Internal Medicine

## 2013-02-10 ENCOUNTER — Other Ambulatory Visit (HOSPITAL_COMMUNITY)
Admission: RE | Admit: 2013-02-10 | Discharge: 2013-02-10 | Disposition: A | Discharge status: Home / Self Care | Payer: BC Managed Care – PPO | Source: Ambulatory Visit | Attending: Family Medicine | Admitting: Family Medicine

## 2013-02-10 ENCOUNTER — Ambulatory Visit (INDEPENDENT_AMBULATORY_CARE_PROVIDER_SITE_OTHER): Payer: BC Managed Care – PPO | Admitting: Medical

## 2013-02-10 ENCOUNTER — Encounter: Payer: Self-pay | Admitting: Medical

## 2013-02-10 VITALS — BP 132/80 | HR 72 | Temp 98.2°F | Resp 16 | Ht 62.2 in | Wt 190.0 lb

## 2013-02-10 DIAGNOSIS — Z23 Encounter for immunization: Secondary | ICD-10-CM

## 2013-02-10 DIAGNOSIS — Z01419 Encounter for gynecological examination (general) (routine) without abnormal findings: Secondary | ICD-10-CM | POA: Insufficient documentation

## 2013-02-10 DIAGNOSIS — Z124 Encounter for screening for malignant neoplasm of cervix: Secondary | ICD-10-CM

## 2013-02-10 DIAGNOSIS — Z0000 Encounter for general adult medical examination without abnormal findings: Secondary | ICD-10-CM

## 2013-02-10 DIAGNOSIS — E282 Polycystic ovarian syndrome: Secondary | ICD-10-CM

## 2013-02-10 LAB — COMPREHENSIVE METABOLIC PANEL
Albumin: 4.6 g/dL (ref 3.5–5.2)
Alkaline Phosphatase: 59 U/L (ref 39–117)
CO2: 24 mEq/L (ref 19–32)
Glucose, Bld: 69 mg/dL — ABNORMAL LOW (ref 70–99)
Potassium: 4.6 mEq/L (ref 3.5–5.3)
Sodium: 141 mEq/L (ref 135–145)
Total Protein: 7.5 g/dL (ref 6.0–8.3)

## 2013-02-10 LAB — POCT WET PREP (WET MOUNT)

## 2013-02-10 LAB — CBC WITH DIFFERENTIAL/PLATELET
Basophils Absolute: 0 10*3/uL (ref 0.0–0.1)
Eosinophils Absolute: 0 10*3/uL (ref 0.0–0.7)
Lymphs Abs: 2.3 10*3/uL (ref 0.7–4.0)
MCH: 27.4 pg (ref 26.0–34.0)
Neutrophils Relative %: 58 % (ref 43–77)
Platelets: 392 10*3/uL (ref 150–400)
RBC: 4.23 MIL/uL (ref 3.87–5.11)
WBC: 6.1 10*3/uL (ref 4.0–10.5)

## 2013-02-10 LAB — POCT URINALYSIS DIPSTICK
Bilirubin, UA: NEGATIVE
Glucose, UA: NEGATIVE
Ketones, UA: NEGATIVE
Spec Grav, UA: 1.02

## 2013-02-10 LAB — LIPID PANEL
LDL Cholesterol: 33 mg/dL (ref 0–99)
Triglycerides: 92 mg/dL (ref <150)

## 2013-02-10 LAB — HEMOGLOBIN A1C: Hgb A1c MFr Bld: 5.1 % (ref <5.7)

## 2013-02-10 LAB — TSH: TSH: 1.732 u[IU]/mL (ref 0.350–4.500)

## 2013-02-10 NOTE — Progress Notes (Signed)
Subjective:   HPI  Allison Wagner is a 27 y.o. female who presents for a complete physical.  Doing well, no specific issues.   Preventative care: Last ophthalmology visit:n/a Last dental visit:yes- Dr. Shirline Frees Last colonoscopy:N/a Last mammogram:N/a Last gynecological exam:Dr. Pennie Rushing Last EKG:n/a Last labs:n/a  Prior vaccinations: TD or Tdap:1993 Influenza:n/a Pneumococcal:n/a Shingles/Zostavax:n/a  Advanced directive:n/a Health care power of attorney:n/a Living will:n/a  Concerns: Asthma - hasn't had to use inhaler recently.  Doing fine.  No recent issues with her asthma.   GERD doing fine, no c/o. Genital herpes - takes Valtrex daily for suppression.  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   Had swabs, diagnosed with HSV 1.   Contraception - on Nuvaring.   PCOS - uses nuvaring.    Reviewed their medical, surgical, family, social, medication, and allergy history and updated chart as appropriate.   Past Medical History  Diagnosis Date  . Yeast infection   . Trichomonas   . Asthma   . Hx of blood clots     vaginal with menstruation  . Polycystic ovarian syndrome 1995  . GERD (gastroesophageal reflux disease)   . Routine gynecological examination     Dr. Pennie Rushing  . Herpes genitalia     type 1  . Seasonal allergic rhinitis   . Dysphagia 2010    hospitalization, diagnosed with GERD and enlarged tonsils    Past Surgical History  Procedure Laterality Date  . Tonsilectomy, adenoidectomy, bilateral myringotomy and tubes      Family History  Problem Relation Age of Onset  . Hypertension Mother   . Migraines Mother   . Fibroids Mother   . Migraines Brother   . Cancer Maternal Aunt     lung  . Heart disease Maternal Grandfather     MI    History   Social History  . Marital Status: Single    Spouse Name: N/A    Number of Children: N/A  . Years of Education: N/A   Occupational History  . Not on file.   Social History Main Topics  . Smoking status: Never  Smoker   . Smokeless tobacco: Never Used  . Alcohol Use: Yes     Comment: occasional   . Drug Use: No  . Sexually Active: Yes    Birth Control/ Protection: Inserts     Comment: nuva ring    Other Topics Concern  . 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 albuterol (PROVENTIL HFA;VENTOLIN HFA) 108 (90 BASE) MCG/ACT inhaler Inhale 2 puffs into the lungs every 6 (six) hours as needed for wheezing.  1 Inhaler  1  . Albuterol (VENTOLIN IN) Inhale into the lungs.      Marland Kitchen BIOTIN PO Take 1 tablet by mouth daily.      . cetirizine (ZYRTEC) 10 MG tablet Take 10 mg by mouth daily.      . Cyanocobalamin (VITAMIN B 12 PO) Take 1 tablet by mouth daily.      Marland Kitchen etonogestrel-ethinyl estradiol (NUVARING) 0.12-0.015 MG/24HR vaginal ring Insert vaginally and leave in place for 3 consecutive weeks, then remove for 1 week.  1 each  12  . ranitidine (ZANTAC) 150 MG tablet Take 1 tablet (150 mg total) by mouth 2 (two) times daily.  60 tablet  0  . valACYclovir (VALTREX) 500  MG tablet Take 1 tablet (500 mg total) by mouth daily.  30 tablet  12   No current facility-administered medications on file prior to visit.    Allergies  Allergen Reactions  . Codeine Other (See Comments)    unsure  . Hydrocodone Other (See Comments)    h/a     Review of Systems Constitutional: -fever, -chills, -sweats, -unexpected weight change, -decreased appetite, -fatigue Allergy: -sneezing, -itching, -congestion Dermatology: -changing moles, --rash, -lumps ENT: -runny nose, -ear pain, -sore throat, -hoarseness, -sinus pain, -teeth pain, - ringing in ears, -hearing loss, -nosebleeds Cardiology: -chest pain, -palpitations, -swelling, -difficulty breathing when lying flat, -waking up short of breath Respiratory: -cough, -shortness of  breath, -difficulty breathing with exercise or exertion, -wheezing, -coughing up blood Gastroenterology: -abdominal pain, -nausea, -vomiting, -diarrhea, -constipation, -blood in stool, -changes in bowel movement, -difficulty swallowing or eating Hematology: -bleeding, -bruising  Musculoskeletal: -joint aches, -muscle aches, -joint swelling, -back pain, -neck pain, -cramping, -changes in gait Ophthalmology: denies vision changes, eye redness, itching, discharge Urology: -burning with urination, -difficulty urinating, -blood in urine, -urinary frequency, -urgency, -incontinence Neurology: +headache, -weakness, +tingling, -numbness, -memory loss, -falls, -dizziness Psychology: -depressed mood, -agitation, -sleep problems     Objective:   Physical Exam  Filed Vitals:   02/10/13 1127  BP: 132/80  Pulse: 72  Temp: 98.2 F (36.8 C)  Resp: 16    General appearance: alert, no distress, WD/WN, AA female Skin: faint small bumps in patch along volar wrists and epigastric abdominal region, suggestive of fordyce spots in appearance. No erythema, no warmth, other rash. HEENT: normocephalic, conjunctiva/corneas normal, sclerae anicteric, PERRLA, EOMi, nares patent, no discharge or erythema, pharynx normal Oral cavity: MMM, tongue normal, teeth in good repair Neck: supple, no lymphadenopathy, no thyromegaly, no masses, normal ROM Chest: non tender, normal shape and expansion Heart: RRR, normal S1, S2, no murmurs Lungs: CTA bilaterally, no wheezes, rhonchi, or rales Abdomen: +bs, soft, non tender, non distended, no masses, no hepatomegaly, no splenomegaly, no bruits Back: non tender, normal ROM, no scoliosis Musculoskeletal: upper extremities non tender, no obvious deformity, normal ROM throughout, lower extremities non tender, no obvious deformity, normal ROM throughout Extremities: no edema, no cyanosis, no clubbing Pulses: 2+ symmetric, upper and lower extremities, normal cap refill Neurological:  alert, oriented x 3, CN2-12 intact, strength normal upper extremities and lower extremities, sensation normal throughout, DTRs 2+ throughout, no cerebellar signs, gait normal Psychiatric: normal affect, behavior normal, pleasant  Breast: not examined. Gyn: Normal external genitalia without lesions, vagina with normal mucosa, cervix without lesions, no cervical motion tenderness, slight white creamy vaginal discharge.  Uterus and adnexa not enlarged, nontender, no masses.  Swabs taken.  Pap performed.  Exam chaperoned by nurse. Rectal: deferred    Assessment and Plan :    Encounter Diagnoses  Name Primary?  . Routine general medical examination at a health care facility Yes  . PCOS (polycystic ovarian syndrome)   . Screening for cervical cancer   . Need for Tdap vaccination     Physical exam - discussed healthy lifestyle, diet, exercise, preventative care, vaccinations, and addressed their concerns. Labs today.   PCOS - normal gyn exam today.  Discussed diagnosis, possible related disease and complications.  C/t Nuvaring, last refilled by gynecology.   Pap today. Discussed safe sex, prevention.   reviewed her recent STD screening from February. tdap vaccine, VIS and counseling today. Follow-up pending labs

## 2013-02-15 LAB — HM PAP SMEAR: HM Pap smear: NEGATIVE

## 2013-04-30 ENCOUNTER — Encounter: Payer: Self-pay | Admitting: Family Medicine

## 2013-04-30 ENCOUNTER — Ambulatory Visit (INDEPENDENT_AMBULATORY_CARE_PROVIDER_SITE_OTHER): Payer: BC Managed Care – PPO | Admitting: Family Medicine

## 2013-04-30 VITALS — BP 138/90 | HR 70 | Temp 98.4°F | Wt 195.0 lb

## 2013-04-30 DIAGNOSIS — Z23 Encounter for immunization: Secondary | ICD-10-CM

## 2013-04-30 DIAGNOSIS — R0781 Pleurodynia: Secondary | ICD-10-CM

## 2013-04-30 DIAGNOSIS — R071 Chest pain on breathing: Secondary | ICD-10-CM

## 2013-04-30 NOTE — Patient Instructions (Signed)
Use 2 Aleve twice a day for the next one or 2 weeks. If you still have difficulty, set up another appointment.

## 2013-04-30 NOTE — Progress Notes (Signed)
  Subjective:    Patient ID: Allison Wagner, female    DOB: 07/13/1986, 27 y.o.   MRN: [id number]  HPI For the last 10 days she's noted intermittent mid back pain with breathing. She is having difficulty with PND but no fever, chills or productive cough . He takes Zyrtec for her allergies and she is using albuterol usually once per month. She does not smoke.   Review of Systems     Objective:   Physical Exam alert and in no distress. Tympanic membranes and canals are normal. Throat is clear. Tonsils are normal. Neck is supple without adenopathy or thyromegaly. Cardiac exam shows a regular sinus rhythm without murmurs or gallops. Lungs are clear to auscultation.        Assessment & Plan:  Pleuritic chest pain  Need for prophylactic vaccination and inoculation against influenza - Plan: Flu Vaccine QUAD 36+ mos IM  recommend 2 Aleve twice per day for the next one or 2 weeks. If continued difficulty she is to return here. Flu shot risks and benefits discussed.

## 2013-06-10 ENCOUNTER — Ambulatory Visit: Payer: BC Managed Care – PPO | Admitting: Family Medicine

## 2013-06-10 ENCOUNTER — Ambulatory Visit (INDEPENDENT_AMBULATORY_CARE_PROVIDER_SITE_OTHER): Payer: BC Managed Care – PPO | Admitting: Family Medicine

## 2013-06-10 ENCOUNTER — Encounter: Payer: Self-pay | Admitting: Family Medicine

## 2013-06-10 VITALS — BP 130/90 | HR 98 | Wt 196.0 lb

## 2013-06-10 DIAGNOSIS — D649 Anemia, unspecified: Secondary | ICD-10-CM

## 2013-06-10 LAB — CBC WITH DIFFERENTIAL/PLATELET
HCT: 33.4 % — ABNORMAL LOW (ref 36.0–46.0)
Hemoglobin: 11.5 g/dL — ABNORMAL LOW (ref 12.0–15.0)
Lymphocytes Relative: 28 % (ref 12–46)
Monocytes Absolute: 0.4 10*3/uL (ref 0.1–1.0)
Monocytes Relative: 4 % (ref 3–12)
Neutro Abs: 5.8 10*3/uL (ref 1.7–7.7)
Neutrophils Relative %: 67 % (ref 43–77)
RBC: 4.05 MIL/uL (ref 3.87–5.11)
WBC: 8.7 10*3/uL (ref 4.0–10.5)

## 2013-06-10 NOTE — Addendum Note (Signed)
Addended by: Ronnald Nian on: 06/10/2013 03:21 PM   Modules accepted: Orders

## 2013-06-10 NOTE — Progress Notes (Signed)
  Subjective:    Patient ID: Allison Wagner, female    DOB: 01-08-1986, 27 y.o.   MRN: [id number]  HPI She is here for recheck. She did demonstrate is slightly low hemoglobin and she has been on a multivitamin as well as taking extra iron. She does have a history of PCO S. She has no particular concerns or complaints.  Review of Systems     Objective:   Physical Exam Alert and in no distress otherwise not examined       Assessment & Plan:  Anemia  PCOS    CBC was ordered.

## 2013-09-14 ENCOUNTER — Ambulatory Visit: Payer: BC Managed Care – PPO | Admitting: Family Medicine

## 2015-02-15 ENCOUNTER — Ambulatory Visit: Payer: Self-pay | Admitting: Medical

## 2015-02-20 ENCOUNTER — Other Ambulatory Visit: Payer: Self-pay | Admitting: Medical

## 2015-09-20 ENCOUNTER — Ambulatory Visit (INDEPENDENT_AMBULATORY_CARE_PROVIDER_SITE_OTHER): Payer: BLUE CROSS/BLUE SHIELD | Admitting: Medical

## 2015-09-20 ENCOUNTER — Encounter: Payer: Self-pay | Admitting: Medical

## 2015-09-20 VITALS — BP 132/98 | HR 84 | Wt 213.0 lb

## 2015-09-20 DIAGNOSIS — R030 Elevated blood-pressure reading, without diagnosis of hypertension: Secondary | ICD-10-CM | POA: Diagnosis not present

## 2015-09-20 DIAGNOSIS — R42 Dizziness and giddiness: Secondary | ICD-10-CM | POA: Diagnosis not present

## 2015-09-20 DIAGNOSIS — R0683 Snoring: Secondary | ICD-10-CM | POA: Insufficient documentation

## 2015-09-20 DIAGNOSIS — Z8249 Family history of ischemic heart disease and other diseases of the circulatory system: Secondary | ICD-10-CM | POA: Insufficient documentation

## 2015-09-20 DIAGNOSIS — R5383 Other fatigue: Secondary | ICD-10-CM | POA: Diagnosis not present

## 2015-09-20 DIAGNOSIS — E669 Obesity, unspecified: Secondary | ICD-10-CM | POA: Insufficient documentation

## 2015-09-20 LAB — LIPID PANEL
Cholesterol: 96 mg/dL — ABNORMAL LOW (ref 125–200)
HDL: 52 mg/dL (ref >=46)
LDL CALC: 29 mg/dL (ref <130)
Total CHOL/HDL Ratio: 1.8 Ratio (ref <=5.0)
Triglycerides: 74 mg/dL (ref <150)
VLDL: 15 mg/dL (ref <30)

## 2015-09-20 LAB — COMPREHENSIVE METABOLIC PANEL
ALK PHOS: 70 U/L (ref 33–115)
ALT: 14 U/L (ref 6–29)
AST: 18 U/L (ref 10–30)
Albumin: 4.1 g/dL (ref 3.6–5.1)
BILIRUBIN TOTAL: 0.3 mg/dL (ref 0.2–1.2)
BUN: 8 mg/dL (ref 7–25)
CHLORIDE: 104 mmol/L (ref 98–110)
CO2: 24 mmol/L (ref 20–31)
CREATININE: 0.66 mg/dL (ref 0.50–1.10)
Calcium: 9.3 mg/dL (ref 8.6–10.2)
Glucose, Bld: 77 mg/dL (ref 65–99)
Potassium: 3.9 mmol/L (ref 3.5–5.3)
SODIUM: 139 mmol/L (ref 135–146)
TOTAL PROTEIN: 7 g/dL (ref 6.1–8.1)

## 2015-09-20 LAB — CBC
HEMATOCRIT: 34.8 % — AB (ref 36.0–46.0)
Hemoglobin: 11.3 g/dL — ABNORMAL LOW (ref 12.0–15.0)
MCH: 26.8 pg (ref 26.0–34.0)
MCHC: 32.5 g/dL (ref 30.0–36.0)
MCV: 82.7 fL (ref 78.0–100.0)
MPV: 8.5 fL — ABNORMAL LOW (ref 8.6–12.4)
PLATELETS: 399 10*3/uL (ref 150–400)
RBC: 4.21 MIL/uL (ref 3.87–5.11)
RDW: 13.5 % (ref 11.5–15.5)
WBC: 8.1 10*3/uL (ref 4.0–10.5)

## 2015-09-20 LAB — TSH: TSH: 1.49 mIU/L

## 2015-09-20 NOTE — Progress Notes (Addendum)
Subjective: Chief Complaint  Patient presents with  . 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 she has a swollen lt ankle. and got dizzy and lightheaded   Here for concern for elevated BP.  In general has been doing fine, but recently at work had eaten some unhealthy food, didn't feel good.  Felt a little dizzy that same day.   Nurse at work checked her BP and it was elevated.    She has not been checking BP in general.   She was on a 21 day fast in January.   In general not exercising, has cut out meat , soda, and fried foods from diet in recent months. Denies edema, chest pain, palpitations.  No other aggravating or relieving factors. No other complaint.  Past Medical History  Diagnosis Date  . Yeast infection   . Trichomonas   . Asthma   . Hx of blood clots     vaginal with menstruation  . Polycystic ovarian syndrome 1995  . GERD (gastroesophageal reflux disease)   . Routine gynecological examination     Dr. Pennie Rushing  . Herpes genitalia     type 1  . Seasonal allergic rhinitis   . Dysphagia 2010    hospitalization, diagnosed with GERD and enlarged tonsils   Family History  Problem Relation Age of Onset  . Hypertension Mother   . Migraines Mother   . Fibroids Mother   . Sleep apnea Mother   . Migraines Brother   . Hypertension Brother   . Sleep apnea Brother   . Cancer Maternal Aunt     lung  . Heart disease Maternal Grandfather     MI    ROS as in subjective   Objective BP 132/98 mmHg  Pulse 84  Wt 213 lb (96.616 kg)  LMP 09/14/2015  General appearance: alert, no distress, WD/WN HEENT: normocephalic, sclerae anicteric, TMs pearly, nares patent, no discharge or erythema, pharynx normal Oral cavity: MMM, no lesions Neck: supple, no lymphadenopathy, no thyromegaly, no masses, no bruits Heart: RRR, normal S1, S2, no murmurs Lungs: CTA bilaterally, no wheezes, rhonchi, or rales Pulses: 2+ symmetric, upper and lower extremities, normal cap refill Ext:  no edema Neuro: CN2-12 intact,non focal exam   Adult ECG Report  Indication: elevated BP  Rate: 82 bpm  Rhythm: normal sinus rhythm  QRS Axis: 4 degrees  PR Interval:  QRS Duration: 86ms  QTc:  Conduction Disturbances: none  Other Abnormalities: none  Patient's cardiac risk factors are: obesity (BMI >= 30 kg/m2).  EKG comparison: none  Narrative Interpretation: normal EKG, somewhat poor R wave progression    Assessment: Encounter Diagnoses  Name Primary?  . Elevated blood pressure reading without diagnosis of hypertension Yes  . Family history of hypertension   . Obesity   . Snoring   . Other fatigue   . Dizziness     Plan: Discussed concerns, possible causes of elevated BP.  Risk factors include overweight, on hormonal contraception, has family hx/o sleep apnea and HTN, has symptoms of sleep apnea.   Discussed need to lose weight, to cut back on calories, to be exercising regularly, work on losing 10-15 lb in the next few months.   EKG reviewed, labs today.   Consider sleep study going forward.  She will start checking BPs outside of here, and get me readings in 2wk.  F/u pending labs and BP readings.

## 2015-09-21 LAB — HEMOGLOBIN A1C
HEMOGLOBIN A1C: 5.2 % (ref <5.7)
Mean Plasma Glucose: 103 mg/dL (ref <117)

## 2015-09-26 ENCOUNTER — Telehealth: Payer: Self-pay | Admitting: Medical

## 2015-09-26 DIAGNOSIS — R0683 Snoring: Secondary | ICD-10-CM

## 2015-09-26 NOTE — Telephone Encounter (Signed)
Pt states that her blood pressure has been high over the past few days. She felt that it was high and yesterday the nurse at work checked it and it was 170's/100 then rechecked again in a few minutes and it was 140's/100. After work pt went to [facility] and checked at 7:05 and it was 173/111 and few minutes later it was 149/104 with heartrate of 65. She has not checked blood pressure this morning but she feels like it is high again because her head is hurting and vision is not clear. This is how she was feeling before when she began to think her pressure was high. Pt said Vincenza Hews wanted to wait befor putting her on bp meds but she wants to know if he thinks she may need to start meds now

## 2015-09-26 NOTE — Telephone Encounter (Signed)
LMTCB

## 2015-09-26 NOTE — Telephone Encounter (Signed)
Put in home sleep study order with [facility]. Pt states she is not trying nor planning to get pregnant. She asked about the bp medicaiton being temporary and I went over with her ways that we know it is time to ween off of the medication and how to know it is working for her. Pt states she felt awful this morning but fine now so she can tell it fluctuates from low to high

## 2015-09-26 NOTE — Telephone Encounter (Signed)
1) we had discussed possibly doing sleep study.   Would she be agreeable to this? 2) is she pregnant, trying to get pregnant or wanting to get pregnant soon ?  This helps me determine choice of Bp medication 3) let me know the above and we can likely start her on BP medication.

## 2015-09-27 ENCOUNTER — Other Ambulatory Visit: Payer: Self-pay | Admitting: Medical

## 2015-09-27 MED ORDER — HYDROCHLOROTHIAZIDE 25 MG PO TABS: 25.0000 mg | ORAL_TABLET | Freq: Every day | ORAL | Status: DC

## 2015-09-27 NOTE — Telephone Encounter (Signed)
Pt is aware and physical is in one month so can discuss then

## 2015-09-27 NOTE — Telephone Encounter (Signed)
Lets start with a fluid blood pressure pill called hydrochlorothiazide once daily at the start of her day.  Drink plenty of water daily.  Lets move forward with sleep study, and f/u in 23mo

## 2015-10-18 ENCOUNTER — Encounter: Payer: Self-pay | Admitting: Medical

## 2015-10-25 ENCOUNTER — Ambulatory Visit (INDEPENDENT_AMBULATORY_CARE_PROVIDER_SITE_OTHER): Payer: BLUE CROSS/BLUE SHIELD | Admitting: Medical

## 2015-10-25 ENCOUNTER — Encounter: Payer: Self-pay | Admitting: Medical

## 2015-10-25 VITALS — BP 130/90 | HR 72 | Ht 62.5 in | Wt 209.0 lb

## 2015-10-25 DIAGNOSIS — D6489 Other specified anemias: Secondary | ICD-10-CM | POA: Diagnosis not present

## 2015-10-25 DIAGNOSIS — N915 Oligomenorrhea, unspecified: Secondary | ICD-10-CM | POA: Diagnosis not present

## 2015-10-25 DIAGNOSIS — E669 Obesity, unspecified: Secondary | ICD-10-CM | POA: Diagnosis not present

## 2015-10-25 DIAGNOSIS — I10 Essential (primary) hypertension: Secondary | ICD-10-CM

## 2015-10-25 DIAGNOSIS — E282 Polycystic ovarian syndrome: Secondary | ICD-10-CM | POA: Diagnosis not present

## 2015-10-25 DIAGNOSIS — A6000 Herpesviral infection of urogenital system, unspecified: Secondary | ICD-10-CM

## 2015-10-25 DIAGNOSIS — K219 Gastro-esophageal reflux disease without esophagitis: Secondary | ICD-10-CM | POA: Insufficient documentation

## 2015-10-25 DIAGNOSIS — R5383 Other fatigue: Secondary | ICD-10-CM | POA: Diagnosis not present

## 2015-10-25 DIAGNOSIS — R252 Cramp and spasm: Secondary | ICD-10-CM

## 2015-10-25 DIAGNOSIS — Z0000 Encounter for general adult medical examination without abnormal findings: Secondary | ICD-10-CM

## 2015-10-25 DIAGNOSIS — J4520 Mild intermittent asthma, uncomplicated: Secondary | ICD-10-CM | POA: Diagnosis not present

## 2015-10-25 DIAGNOSIS — Z131 Encounter for screening for diabetes mellitus: Secondary | ICD-10-CM | POA: Insufficient documentation

## 2015-10-25 DIAGNOSIS — Z8249 Family history of ischemic heart disease and other diseases of the circulatory system: Secondary | ICD-10-CM

## 2015-10-25 DIAGNOSIS — R0683 Snoring: Secondary | ICD-10-CM | POA: Diagnosis not present

## 2015-10-25 LAB — POCT URINALYSIS DIPSTICK
Bilirubin, UA: NEGATIVE
Blood, UA: NEGATIVE
Glucose, UA: NEGATIVE
KETONES UA: NEGATIVE
LEUKOCYTES UA: NEGATIVE
NITRITE UA: NEGATIVE
PH UA: 6
PROTEIN UA: NEGATIVE
Spec Grav, UA: >=1.03
Urobilinogen, UA: NEGATIVE

## 2015-10-25 NOTE — Progress Notes (Signed)
Subjective:   HPI  Allison Wagner is a 30 y.o. female who presents for a complete physical.  \  Concerns: Recent anemia on labs.   Has been borderline anemic prior, used iron in the past.  Has restarted iron once daily since last visit in 09/2015.  Periods aren't particularly heavy when on birth control, but off birth control periods are heavy.  Since last visit has list of BPs and most are 150-170/90-100.  Compliant with HCTZ started 3 weeks ago but is having muscle spasm/cramps.  Asthma - hasn't had to use inhaler recently.  Doing fine.  No recent issues with her asthma.   GERD doing fine, no c/o. Genital herpes - takes Valtrex prn.  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   Had swabs, diagnosed with HSV 1.   Contraception - on Nuvaring.   PCOS - uses nuvaring. Sees gyn   Reviewed their medical, surgical, family, social, medication, and allergy history and updated chart as appropriate.   Past Medical History  Diagnosis Date  . Yeast infection   . Trichomonas   . Asthma   . Hx of blood clots     vaginal with menstruation  . Polycystic ovarian syndrome 1995  . GERD (gastroesophageal reflux disease)   . Routine gynecological examination     Dr. Pennie Rushing  . Herpes genitalia     type 1  . Seasonal allergic rhinitis   . Dysphagia 2010    hospitalization, diagnosed with GERD and enlarged tonsils    Past Surgical History  Procedure Laterality Date  . Tonsilectomy, adenoidectomy, bilateral myringotomy and tubes      Family History  Problem Relation Age of Onset  . Hypertension Mother   . Migraines Mother   . Fibroids Mother   . Sleep apnea Mother   . Migraines Brother   . Hypertension Brother   . Sleep apnea Brother   . Cancer Maternal Aunt     lung  . Heart disease Maternal Grandfather     MI  . Hypertension Father     Social History   Social History  . Marital Status: Single    Spouse Name: N/A  . Number of Children: N/A  . Years of Education: N/A   Occupational  History  . Not on file.   Social History Main Topics  . Smoking status: Never Smoker   . Smokeless tobacco: Never Used  . Alcohol Use: Yes     Comment: occasional   . Drug Use: No  . Sexual Activity: Yes    Birth Control/ Protection: Inserts     Comment: nuva ring    Other Topics Concern  . 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  Moving back to [place].   10/2015    Current Outpatient Prescriptions on File Prior to Visit  Medication Sig Dispense Refill  . etonogestrel-ethinyl estradiol (NUVARING) 0.12-0.015 MG/24HR vaginal ring Insert vaginally and leave in place for 3 consecutive weeks, then remove for 1 week. 1 each 12  . hydrochlorothiazide (HYDRODIURIL) 25 MG tablet Take 1 tablet (25 mg total) by mouth daily. 30 tablet 2  . cetirizine (ZYRTEC) 10 MG tablet Take 10 mg by mouth daily. Reported on 10/25/2015    . ranitidine (ZANTAC) 150 MG tablet Take 1 tablet (150 mg total) by mouth 2 (two) times daily. (Patient not taking: Reported on 10/25/2015) 60 tablet 0  . valACYclovir (VALTREX)  500 MG tablet Take 1 tablet (500 mg total) by mouth daily. (Patient not taking: Reported on 09/20/2015) 30 tablet 12  . VENTOLIN HFA 108 (90 BASE) MCG/ACT inhaler INHALE TWO PUFFS INTO LUNGS EVERY 6 HOURS AS NEEDED FOR WHEEZING (Patient not taking: Reported on 09/20/2015) 18 each 0   No current facility-administered medications on file prior to visit.    Allergies  Allergen Reactions  . Codeine Other (See Comments)    unsure  . Hydrocodone Other (See Comments)    h/a     Review of Systems Constitutional: -fever, -chills, -sweats, -unexpected weight change, -decreased appetite, -fatigue Allergy: -sneezing, -itching, -congestion Dermatology: -changing moles, --rash, -lumps ENT: -runny nose, -ear pain, -sore throat, -hoarseness, -sinus pain, -teeth pain, - ringing in ears, -hearing  loss, -nosebleeds Cardiology: -chest pain, -palpitations, -swelling, -difficulty breathing when lying flat, -waking up short of breath Respiratory: -cough, -shortness of breath, -difficulty breathing with exercise or exertion, -wheezing, -coughing up blood Gastroenterology: -abdominal pain, -nausea, -vomiting, -diarrhea, -constipation, -blood in stool, -changes in bowel movement, -difficulty swallowing or eating Hematology: -bleeding, -bruising  Musculoskeletal: -joint aches, +muscle aches, -joint swelling, -back pain, -neck pain, +cramping, -changes in gait Ophthalmology: denies vision changes, eye redness, itching, discharge Urology: -burning with urination, -difficulty urinating, -blood in urine, -urinary frequency, -urgency, -incontinence Neurology: -headache, -weakness, +tingling, -numbness, -memory loss, -falls, -dizziness Psychology: -depressed mood, -agitation, -sleep problems     Objective:   Physical Exam  BP 130/90 mmHg  Pulse 72  Ht 5' 2.5" (1.588 m)  Wt 209 lb (94.802 kg)  BMI 37.59 kg/m2  LMP 09/14/2015  General appearance: alert, no distress, WD/WN, AA female Skin: faint small bumps in patch along volar wrists and epigastric abdominal region, suggestive of fordyce spots in appearance. Tattoo right volar wrist.  Neck circumferential with darkened skin suggesting early acanthosis nigricans.  No erythema, no warmth, other rash. HEENT: normocephalic, conjunctiva/corneas normal, sclerae anicteric, PERRLA, EOMi, nares patent, no discharge or erythema, pharynx normal Oral cavity: MMM, tongue normal, teeth in good repair Neck: supple, no lymphadenopathy, no thyromegaly, no masses, normal ROM Chest: non tender, normal shape and expansion Heart: RRR, normal S1, S2, no murmurs Lungs: CTA bilaterally, no wheezes, rhonchi, or rales Abdomen: +bs, soft, non tender, non distended, no masses, no hepatomegaly, no splenomegaly, no bruits Back: non tender, normal ROM, no  scoliosis Musculoskeletal: upper extremities non tender, no obvious deformity, normal ROM throughout, lower extremities non tender, no obvious deformity, normal ROM throughout Extremities: no edema, no cyanosis, no clubbing Pulses: 2+ symmetric, upper and lower extremities, normal cap refill Neurological: alert, oriented x 3, CN2-12 intact, strength normal upper extremities and lower extremities, sensation normal throughout, DTRs 2+ throughout, no cerebellar signs, gait normal Psychiatric: normal affect, behavior normal, pleasant  Breast: not examined. Gyn/rectal - deferred to gyn    Assessment and Plan :    Encounter Diagnoses  Name Primary?  . Encounter for health maintenance examination in adult Yes  . PCOS (polycystic ovarian syndrome)   . Oligomenorrhea   . Obesity   . Snoring   . Other fatigue   . Family history of hypertension   . Asthma, mild intermittent, uncomplicated   . Gastroesophageal reflux disease without esophagitis   . Genital herpes   . Anemia due to other cause   . Essential hypertension   . Muscle cramp     Physical exam - discussed healthy lifestyle, diet, exercise, preventative care, vaccinations, and addressed their concerns PCOS - managed by gyn obesity - c/t  efforts at weight loss, exercise Snoring, fatigue - will f/u on sleep study.  She has heard back about study Pending labs,will need to modify BP regimen.  Having cramps with plain HCTZ that we started a few weeks ago discussed diagnosis of hypertension, complications, treatment. Anemia - will send home with stool cards, but likely due to menstruation and diet. Follow-up pending labs

## 2015-10-25 NOTE — Addendum Note (Signed)
Addended by: Kieth BrightlyLAWSON, Lonell Stamos M on: 10/25/2015 11:41 AM   Modules accepted: Orders, SmartSet

## 2015-10-26 ENCOUNTER — Other Ambulatory Visit: Payer: Self-pay | Admitting: Medical

## 2015-10-26 LAB — BASIC METABOLIC PANEL
BUN: 13 mg/dL (ref 7–25)
CALCIUM: 9.9 mg/dL (ref 8.6–10.2)
CO2: 27 mmol/L (ref 20–31)
Chloride: 99 mmol/L (ref 98–110)
Creat: 0.7 mg/dL (ref 0.50–1.10)
GLUCOSE: 84 mg/dL (ref 65–99)
Potassium: 4.3 mmol/L (ref 3.5–5.3)
SODIUM: 139 mmol/L (ref 135–146)

## 2015-10-26 MED ORDER — LABETALOL HCL 200 MG PO TABS: 200.0000 mg | ORAL_TABLET | Freq: Two times a day (BID) | ORAL | Status: DC

## 2015-10-26 MED ORDER — FLUCONAZOLE 150 MG PO TABS: ORAL_TABLET | ORAL | Status: DC

## 2015-11-02 ENCOUNTER — Telehealth: Payer: Self-pay | Admitting: Medical

## 2015-11-02 NOTE — Telephone Encounter (Signed)
Pt called and stated that her BP meds were recently changed. She states that now she is having issues with swelling in her hands. Pt was offered an appt and she states that she can't take any more time off from work. Please call pt and advise. Pt can be reach at [phone] and uses [facility] on [address].

## 2015-11-03 NOTE — Telephone Encounter (Signed)
Limit salt intake as it could be from that.    The BP medication we started shouldn't cause this.    We will need to f/u pending sleep study anyhow and can discuss further then.

## 2015-11-03 NOTE — Telephone Encounter (Signed)
States she does not eat a lot of salt. Started Monday or Tuesday. York SpanielSaid that this happened a while back before any medication, thought that was from walking or the cafeteria she ate at. Said her bp 134/94 and 127/95 yesterday. Said she mainly eats salad with [place] yogurt ranch dressing. Her sleep study is 11/20/15.

## 2015-11-06 ENCOUNTER — Ambulatory Visit: Payer: Self-pay | Admitting: Medical

## 2015-11-20 ENCOUNTER — Encounter (HOSPITAL_BASED_OUTPATIENT_CLINIC_OR_DEPARTMENT_OTHER): Payer: Self-pay

## 2015-11-29 ENCOUNTER — Ambulatory Visit (HOSPITAL_BASED_OUTPATIENT_CLINIC_OR_DEPARTMENT_OTHER): Payer: BLUE CROSS/BLUE SHIELD

## 2015-11-30 ENCOUNTER — Ambulatory Visit (HOSPITAL_BASED_OUTPATIENT_CLINIC_OR_DEPARTMENT_OTHER): Payer: BLUE CROSS/BLUE SHIELD | Attending: Medical | Admitting: Internal Medicine

## 2015-11-30 VITALS — Ht 62.0 in | Wt 212.0 lb

## 2015-11-30 DIAGNOSIS — G4733 Obstructive sleep apnea (adult) (pediatric): Secondary | ICD-10-CM | POA: Diagnosis not present

## 2015-11-30 DIAGNOSIS — G4719 Other hypersomnia: Secondary | ICD-10-CM | POA: Diagnosis not present

## 2015-11-30 DIAGNOSIS — G4730 Sleep apnea, unspecified: Secondary | ICD-10-CM

## 2015-11-30 DIAGNOSIS — R5383 Other fatigue: Secondary | ICD-10-CM | POA: Insufficient documentation

## 2015-11-30 DIAGNOSIS — G4710 Hypersomnia, unspecified: Secondary | ICD-10-CM

## 2015-11-30 DIAGNOSIS — R0683 Snoring: Secondary | ICD-10-CM | POA: Insufficient documentation

## 2015-12-23 DIAGNOSIS — G4710 Hypersomnia, unspecified: Secondary | ICD-10-CM

## 2015-12-23 DIAGNOSIS — R0683 Snoring: Secondary | ICD-10-CM | POA: Diagnosis not present

## 2015-12-23 DIAGNOSIS — G4730 Sleep apnea, unspecified: Secondary | ICD-10-CM | POA: Diagnosis not present

## 2015-12-23 NOTE — Procedures (Signed)
   Patient Name: Allison Wagner, Allison Wagner: 11/30/2015 Gender: Female D.O.B: Dec 11, 1985 Age (years): 29 Referring Provider: Crosby Oysteravid Tysinger Height (inches): 62 Interpreting Physician: Jetty Duhamellinton Eames Dibiasio MD, ABSM Weight (lbs): 212 RPSGT: [place], [place] BMI: 39 MRN: [id number] Neck Size: 14.00 CLINICAL INFORMATION Sleep Study Type: unattended Home Sleep Test   Indication for sleep study: Fatigue, Snoring   Epworth Sleepiness Score: 6  SLEEP STUDY TECHNIQUE A multi-channel overnight portable sleep study was performed. The channels recorded were: nasal airflow, thoracic respiratory movement, and oxygen saturation with a pulse oximetry. Snoring was also monitored.  MEDICATIONS Patient self administered medications include:none reported during study  SLEEP ARCHITECTURE Patient was studied for 443.5 minutes. The sleep efficiency was 99.7 % and the patient was supine for 60.9%. The arousal index was 0.0 per hour.  RESPIRATORY PARAMETERS The overall AHI was 18.1 per hour, with a central apnea index of 0.1 per hour. The oxygen nadir was 83% during sleep.  CARDIAC DATA Mean heart rate during sleep was 82.5 bpm.  IMPRESSIONS - Moderate obstructive sleep apnea occurred during this study (AHI = 18.1/h). - No significant central sleep apnea occurred during this study (CAI = 0.1/h). - Moderate oxygen desaturation was noted during this study (Min O2 = 83%). - Patient snored   DIAGNOSIS - Obstructive Sleep Apnea (327.23 [G47.33 ICD-10])  RECOMMENDATIONS - CPAP titration is usually the first recommended intervention for scores in this range. - Avoid alcohol, sedatives and other CNS depressants that may worsen sleep apnea and disrupt normal sleep architecture. - Sleep hygiene should be reviewed to assess factors that may improve sleep quality. - Weight management and regular exercise should be initiated or continued.  Waymon BudgeYOUNG,Camaria Gerald D Diplomate, American Board of Sleep  Medicine  ELECTRONICALLY SIGNED ON:  12/23/2015, 10:08 AM  [facility] PH: [phone]   FX: [phone] ACCREDITED BY THE AMERICAN ACADEMY OF SLEEP MEDICINE

## 2015-12-25 ENCOUNTER — Telehealth: Payer: Self-pay | Admitting: Medical

## 2015-12-25 NOTE — Telephone Encounter (Signed)
appt made

## 2015-12-25 NOTE — Telephone Encounter (Signed)
Have her return for f/u visit to discuss sleep study results and BP f/u.  She has moderate sleep apnea.

## 2016-01-25 ENCOUNTER — Ambulatory Visit (INDEPENDENT_AMBULATORY_CARE_PROVIDER_SITE_OTHER): Payer: BLUE CROSS/BLUE SHIELD | Admitting: Medical

## 2016-01-25 ENCOUNTER — Encounter: Payer: Self-pay | Admitting: Medical

## 2016-01-25 VITALS — BP 128/74 | HR 64 | Ht 62.0 in | Wt 214.0 lb

## 2016-01-25 DIAGNOSIS — I10 Essential (primary) hypertension: Secondary | ICD-10-CM

## 2016-01-25 DIAGNOSIS — J4520 Mild intermittent asthma, uncomplicated: Secondary | ICD-10-CM | POA: Diagnosis not present

## 2016-01-25 DIAGNOSIS — T50905A Adverse effect of unspecified drugs, medicaments and biological substances, initial encounter: Secondary | ICD-10-CM

## 2016-01-25 DIAGNOSIS — G4733 Obstructive sleep apnea (adult) (pediatric): Secondary | ICD-10-CM | POA: Diagnosis not present

## 2016-01-25 MED ORDER — AMLODIPINE BESYLATE 5 MG PO TABS: 5.0000 mg | ORAL_TABLET | Freq: Every day | ORAL | Status: DC

## 2016-01-25 NOTE — Progress Notes (Signed)
Subjective: Chief Complaint  Patient presents with  . Follow-up    on hypertension and sleep study.    Here for f/u on BP and sleep study  Compliant with Labetalol 200mg  BID, but only takes HCTZ 25mg  daily sometimes.  Gives her some cramps.  Using some OTC potassium when she takes HCTZ, which is only with LE edema.  This is intermittent, maybe once weekly now.  Exercise - exercising regularly, going to gym  Diet - avoiding salt, trying to eat healthy   Past Medical History  Diagnosis Date  . Yeast infection   . Trichomonas   . Asthma   . Hx of blood clots     vaginal with menstruation  . Polycystic ovarian syndrome 1995  . GERD (gastroesophageal reflux disease)   . Routine gynecological examination     Dr. Pennie RushingHaygood  . Herpes genitalia     type 1  . Seasonal allergic rhinitis   . Dysphagia 2010    hospitalization, diagnosed with GERD and enlarged tonsils   ROS as in subjective   Objective BP 128/74 mmHg  Pulse 64  Ht 5\' 2"  (1.575 m)  Wt 214 lb (97.07 kg)  BMI 39.13 kg/m2  LMP 12/27/2015  BP Readings from Last 3 Encounters:  01/25/16 128/74  10/25/15 130/90  09/20/15 132/98   Wt Readings from Last 3 Encounters:  01/25/16 214 lb (97.07 kg)  11/30/15 212 lb (96.163 kg)  10/25/15 209 lb (94.802 kg)   General appearance: alert, no distress, WD/WN Neck: supple, no lymphadenopathy, no thyromegaly, no masses Heart: RRR, normal S1, S2, no murmurs Lungs: CTA bilaterally, no wheezes, rhonchi, or rales Pulses: 2+ symmetric, upper and lower extremities, normal cap refill Ext: no edema   Assessment: Encounter Diagnoses  Name Primary?  . Essential hypertension Yes  . OSA (obstructive sleep apnea)   . Adverse effects of medication, initial encounter   . Asthma, mild intermittent, uncomplicated      Plan: Wean off Labetalol taking 1 tablet daily for 3 days then stop.  Stop HCTZ.  Begin amlodipine 5mg  daily.  discussed risks/benefits of medication Refer for  CPAP C/t efforts at weight loss with exercise and diet.   discussed target heart rate Asthma - controlled, use albuterol prn.  F/u 50mo.

## 2016-01-25 NOTE — Patient Instructions (Signed)
Recommendations:  Take Labetalol 1 tablet daily for 3 days then stop  STOP hydrochlorothiazide  Begin Amlodipine 5mg  daily for blood pressure  We will refer you for CPAP   Monitor BPs  Your target heart rate should be 120-140  Recheck 1 month

## 2016-02-26 ENCOUNTER — Ambulatory Visit: Payer: BLUE CROSS/BLUE SHIELD | Admitting: Medical

## 2016-02-27 ENCOUNTER — Ambulatory Visit (INDEPENDENT_AMBULATORY_CARE_PROVIDER_SITE_OTHER): Payer: BLUE CROSS/BLUE SHIELD | Admitting: Medical

## 2016-02-27 ENCOUNTER — Encounter: Payer: Self-pay | Admitting: Medical

## 2016-02-27 VITALS — BP 126/82 | HR 79 | Wt 217.0 lb

## 2016-02-27 DIAGNOSIS — G4733 Obstructive sleep apnea (adult) (pediatric): Secondary | ICD-10-CM

## 2016-02-27 DIAGNOSIS — R8290 Unspecified abnormal findings in urine: Secondary | ICD-10-CM | POA: Diagnosis not present

## 2016-02-27 DIAGNOSIS — D6489 Other specified anemias: Secondary | ICD-10-CM

## 2016-02-27 DIAGNOSIS — K219 Gastro-esophageal reflux disease without esophagitis: Secondary | ICD-10-CM | POA: Diagnosis not present

## 2016-02-27 DIAGNOSIS — I10 Essential (primary) hypertension: Secondary | ICD-10-CM | POA: Diagnosis not present

## 2016-02-27 LAB — POCT URINALYSIS DIPSTICK
BILIRUBIN UA: NEGATIVE
GLUCOSE UA: NEGATIVE
KETONES UA: NEGATIVE
LEUKOCYTES UA: NEGATIVE
NITRITE UA: NEGATIVE
PH UA: 5.5
Spec Grav, UA: >=1.03
Urobilinogen, UA: 0.2

## 2016-02-27 MED ORDER — AMLODIPINE BESYLATE 5 MG PO TABS: 5.0000 mg | ORAL_TABLET | Freq: Every day | ORAL | Status: DC

## 2016-02-27 MED ORDER — RANITIDINE HCL 150 MG PO TABS: 150.0000 mg | ORAL_TABLET | Freq: Two times a day (BID) | ORAL | Status: DC

## 2016-02-27 NOTE — Progress Notes (Signed)
Subjective: Chief Complaint  Patient presents with  . Follow-up    said all was fine. said that a few years ago she was hospitalized at [facility] for acid reflux and stated that otc is not working for her and would like a RX for acid reflux medicine.    Here for f/u on BP and sleep apnea.   Since last visit she weaned off Labetalol.   Is taking the Amlodipine 5mg  daily.  Not taking HCTZ currently.   Keeps this for prn use for edema.   Does see mild edema of ankles at times, but not bad, not like the Labetalol.    Since last visit not using CPAP.   Home health told her it would take $400 up front and monthly rental fee.  [facility] [place] [facility] [place].    She can't afford this.   Has questions about her kidneys.  Some times has odor with urination.   Sometimes gets spasm in back.   Drinks a lot water, but doesn't necessarily think she has urinary frequency, no hematuria, no urine urgency.  No vaginal discharge. No fever, no abdominal pain.  No other aggravating or relieving factors. No other complaint.  GERD - using OTC zantac, clearing throat a lot of late.   Past Medical History  Diagnosis Date  . Yeast infection   . Trichomonas   . Asthma   . Hx of blood clots     vaginal with menstruation  . Polycystic ovarian syndrome 1995  . GERD (gastroesophageal reflux disease)   . Routine gynecological examination     Dr. Pennie RushingHaygood  . Herpes genitalia     type 1  . Seasonal allergic rhinitis   . Dysphagia 2010    hospitalization, diagnosed with GERD and enlarged tonsils   ROS as in subjective   Objective: BP 126/82 mmHg  Pulse 79  Wt 217 lb (98.431 kg)  LMP 02/24/2016  BP Readings from Last 3 Encounters:  02/27/16 126/82  01/25/16 128/74  10/25/15 130/90   Wt Readings from Last 3 Encounters:  02/27/16 217 lb (98.431 kg)  01/25/16 214 lb (97.07 kg)  11/30/15 212 lb (96.163 kg)   General appearance: alert, no distress, WD/WN Neck: supple, no lymphadenopathy,  no thyromegaly, no masses Heart: RRR, normal S1, S2, no murmurs Lungs: CTA bilaterally, no wheezes, rhonchi, or rales Abdomen: +bs, soft, non tender, non distended, no masses, no hepatomegaly, no splenomegaly Back nontender Ext: no edema Pulses: 2+ symmetric, upper and lower extremities, normal cap refill   Assessment: Encounter Diagnoses  Name Primary?  . Essential hypertension Yes  . Abnormal urine odor   . OSA (obstructive sleep apnea)   . Anemia due to other cause   . Gastroesophageal reflux disease without esophagitis      Plan: HTN - doing fine on Amlodipine 5mg  daily.  Can c/t HCTZ prn UA reviewd, she is starting her period, so + blood.   Advised good water intake, return/call back if new symptoms suggesting UTI. OSA - advised she contact her insurer about CPAP coverage, copay, out of pocket expectations anemia - advised she turn in stool cards x 3.   reviewed last notes and labs in regards to mild anemia. GERD - begin prescription Ranitidine, avoid GERD triggers.

## 2016-05-07 ENCOUNTER — Other Ambulatory Visit: Payer: Self-pay | Admitting: Medical

## 2017-04-29 ENCOUNTER — Other Ambulatory Visit: Payer: Self-pay | Admitting: Medical

## 2017-04-29 ENCOUNTER — Telehealth: Payer: Self-pay | Admitting: *Deleted

## 2017-04-29 NOTE — Telephone Encounter (Signed)
Spoke with patient and she longer has insurance. I told her I would refill her bp med for 30 days to give her the time to save for an OV and gave her the pricing scale for self pay patients. She did verbalize understanding. Rx sent for 30 days only-she stated the would call back in a few days.

## 2017-07-02 ENCOUNTER — Other Ambulatory Visit: Payer: Self-pay | Admitting: Medical

## 2017-07-02 ENCOUNTER — Telehealth: Payer: Self-pay | Admitting: Medical

## 2017-07-02 MED ORDER — AMLODIPINE BESYLATE 5 MG PO TABS: 5.0000 mg | ORAL_TABLET | Freq: Every day | ORAL | 0 refills | Status: DC

## 2017-07-02 NOTE — Telephone Encounter (Signed)
[facility] called for pt requesting refill for her rx Amlodipine she is transferring to them but cant get into there until Monday, she would like it sent to the [facility] [place] fax number is [phone]

## 2018-10-21 ENCOUNTER — Encounter: Payer: Self-pay | Admitting: Medical

## 2018-10-21 ENCOUNTER — Ambulatory Visit (INDEPENDENT_AMBULATORY_CARE_PROVIDER_SITE_OTHER): Payer: Self-pay | Admitting: Medical

## 2018-10-21 ENCOUNTER — Other Ambulatory Visit: Payer: Self-pay

## 2018-10-21 VITALS — BP 140/92 | HR 62 | Temp 98.1°F | Resp 16 | Ht 62.0 in | Wt 226.4 lb

## 2018-10-21 DIAGNOSIS — A6000 Herpesviral infection of urogenital system, unspecified: Secondary | ICD-10-CM

## 2018-10-21 DIAGNOSIS — J4520 Mild intermittent asthma, uncomplicated: Secondary | ICD-10-CM

## 2018-10-21 DIAGNOSIS — I10 Essential (primary) hypertension: Secondary | ICD-10-CM

## 2018-10-21 MED ORDER — VALACYCLOVIR HCL 500 MG PO TABS: ORAL_TABLET | ORAL | 3 refills | Status: DC

## 2018-10-21 MED ORDER — ALBUTEROL SULFATE HFA 108 (90 BASE) MCG/ACT IN AERS: INHALATION_SPRAY | RESPIRATORY_TRACT | 1 refills | Status: DC

## 2018-10-21 MED ORDER — AMLODIPINE BESYLATE 5 MG PO TABS: 5.0000 mg | ORAL_TABLET | Freq: Every day | ORAL | 0 refills | Status: DC

## 2018-10-21 NOTE — Progress Notes (Signed)
Subjective:     Patient ID: Allison Wagner, female   DOB: 07/31/1986, 33 y.o.   MRN: [id number]  HPI Chief Complaint  Patient presents with  . Med check    med check and bp   Here for recheck.   Last visit here 2017.  Since last visit several life changes ,been back and forth between [place].    She found out recently that her gynecologist, Dr. Dierdre Forth is no longer practicing.  Wanted to get medication for Valtrex.  Had been out in a while.  Was on it in the past for prevention.   Uses for genital herpes.    HTN - is taking Amlodipine 5mg , but on last few pills.  Was getting refills from doctor in [place] for a while.  Checks BPs, and in general is normal.  So she attributes her BP to stress and some recent fast food.     Past Medical History:  Diagnosis Date  . Asthma   . Dysphagia 2010   hospitalization, diagnosed with GERD and enlarged tonsils  . GERD (gastroesophageal reflux disease)   . Herpes genitalia    type 1  . Hx of blood clots    vaginal with menstruation  . Polycystic ovarian syndrome 1995  . Routine gynecological examination    Dr. Pennie Rushing  . Seasonal allergic rhinitis   . Trichomonas   . Yeast infection    Current Outpatient Medications on File Prior to Visit  Medication Sig Dispense Refill  . cetirizine (ZYRTEC) 10 MG tablet Take 10 mg by mouth daily. Reported on 02/27/2016    . etonogestrel-ethinyl estradiol (NUVARING) 0.12-0.015 MG/24HR vaginal ring Insert vaginally and leave in place for 3 consecutive weeks, then remove for 1 week. (Patient not taking: Reported on 10/21/2018) 1 each 12  . ranitidine (ZANTAC) 150 MG tablet TAKE ONE TABLET BY MOUTH TWICE DAILY. (Patient not taking: Reported on 10/21/2018) 60 tablet 1   No current facility-administered medications on file prior to visit.      Review of Systems As in subjective    Objective:   Physical Exam BP (!) 140/92   Pulse 62   Temp 98.1 F (36.7 C) (Oral)   Resp 16   Ht 5\' 2"  (1.575  m)   Wt 226 lb 6.4 oz (102.7 kg)   LMP 10/10/2018 (Exact Date)   SpO2 98%   BMI 41.41 kg/m   Wt Readings from Last 3 Encounters:  10/21/18 226 lb 6.4 oz (102.7 kg)  02/27/16 217 lb (98.4 kg)  01/25/16 214 lb (97.1 kg)   BP Readings from Last 3 Encounters:  10/21/18 (!) 140/92  02/27/16 126/82  01/25/16 128/74   General appearance: alert, no distress, WD/WN,  HEENT: normocephalic, sclerae anicteric, TMs pearly, nares patent, no discharge or erythema, pharynx normal Oral cavity: MMM, no lesions Neck: supple, no lymphadenopathy, no thyromegaly, no masses Heart: RRR, normal S1, S2, no murmurs Lungs: CTA bilaterally, no wheezes, rhonchi, or rales Pulses: 2+ symmetric, upper and lower extremities, normal cap refill       Assessment:     Encounter Diagnoses  Name Primary?  . Essential hypertension Yes  . Mild intermittent asthma without complication   . Genital herpes simplex, unspecified site        Plan:     HTN - home readings normal.   Work on lifestyel changes, salt limitations, refilled medications, and recheck in 3 months  Asthma - mild intermittent, refilled albuterol for prn use.  Discussed allergy season.  Genital herpes - discussed acute therapy vs prevention.   Discussed condom use.      Malaijah was seen today for med check.  Diagnoses and all orders for this visit:  Essential hypertension  Mild intermittent asthma without complication  Genital herpes simplex, unspecified site  Other orders -     valACYclovir (VALTREX) 500 MG tablet; Daily  forprevention, 4 tablets BID x 1 day for flare up -     amLODipine (NORVASC) 5 MG tablet; Take 1 tablet (5 mg total) by mouth daily. -     albuterol (VENTOLIN HFA) 108 (90 Base) MCG/ACT inhaler; INHALE TWO PUFFS INTO LUNGS EVERY 6 HOURS AS NEEDED FOR WHEEZING

## 2019-01-21 ENCOUNTER — Ambulatory Visit (INDEPENDENT_AMBULATORY_CARE_PROVIDER_SITE_OTHER): Payer: Self-pay | Admitting: Medical

## 2019-01-21 ENCOUNTER — Encounter: Payer: Self-pay | Admitting: Medical

## 2019-01-21 ENCOUNTER — Other Ambulatory Visit: Payer: Self-pay

## 2019-01-21 VITALS — BP 120/80 | HR 92 | Temp 98.3°F | Resp 16 | Ht 62.0 in | Wt 230.2 lb

## 2019-01-21 DIAGNOSIS — K219 Gastro-esophageal reflux disease without esophagitis: Secondary | ICD-10-CM

## 2019-01-21 DIAGNOSIS — E669 Obesity, unspecified: Secondary | ICD-10-CM

## 2019-01-21 DIAGNOSIS — I10 Essential (primary) hypertension: Secondary | ICD-10-CM

## 2019-01-21 DIAGNOSIS — L729 Follicular cyst of the skin and subcutaneous tissue, unspecified: Secondary | ICD-10-CM | POA: Insufficient documentation

## 2019-01-21 DIAGNOSIS — J4520 Mild intermittent asthma, uncomplicated: Secondary | ICD-10-CM

## 2019-01-21 DIAGNOSIS — E282 Polycystic ovarian syndrome: Secondary | ICD-10-CM

## 2019-01-21 DIAGNOSIS — Z131 Encounter for screening for diabetes mellitus: Secondary | ICD-10-CM

## 2019-01-21 DIAGNOSIS — L989 Disorder of the skin and subcutaneous tissue, unspecified: Secondary | ICD-10-CM

## 2019-01-21 DIAGNOSIS — A6000 Herpesviral infection of urogenital system, unspecified: Secondary | ICD-10-CM

## 2019-01-21 LAB — BASIC METABOLIC PANEL
BUN/Creatinine Ratio: 16 (ref 9–23)
BUN: 13 mg/dL (ref 6–20)
CO2: 24 mmol/L (ref 20–29)
Calcium: 9.6 mg/dL (ref 8.7–10.2)
Chloride: 101 mmol/L (ref 96–106)
Creatinine, Ser: 0.83 mg/dL (ref 0.57–1.00)
GFR calc Af Amer: 107 mL/min/{1.73_m2} (ref >59)
GFR calc non Af Amer: 93 mL/min/{1.73_m2} (ref >59)
Glucose: 87 mg/dL (ref 65–99)
Potassium: 4.6 mmol/L (ref 3.5–5.2)
Sodium: 139 mmol/L (ref 134–144)

## 2019-01-21 LAB — HEMOGLOBIN A1C
Est. average glucose Bld gHb Est-mCnc: 103 mg/dL
Hgb A1c MFr Bld: 5.2 % (ref 4.8–5.6)

## 2019-01-21 NOTE — Progress Notes (Signed)
Subjective: Chief Complaint  Patient presents with  . Hypertension   Here for med check.  She notes that she has no insurance.   Hypertension-compliant with amlodipine without complaint  Allergies-takes Zyrtec seasonal.  No Particular complaints  Asthma- not having use inhaler, no complaints.  She has several moles that are fairly new that she wants me to look at  She is working on [employer], exercising by walking in her neighborhood.  She does not eat out.  She cooks all of her meals at home.  She typically eats 2 meals a day  She thinks she may have toenail fungus of the right great toenail compared to her left great toe nail in the past few weeks    Past Medical History:  Diagnosis Date  . Asthma   . Dysphagia 2010   hospitalization, diagnosed with GERD and enlarged tonsils  . GERD (gastroesophageal reflux disease)   . Herpes genitalia    type 1  . Hx of blood clots    vaginal with menstruation  . Polycystic ovarian syndrome 1995  . Routine gynecological examination    Dr. Leo Grosser  . Seasonal allergic rhinitis   . Trichomonas   . Yeast infection    Current Outpatient Medications on File Prior to Visit  Medication Sig Dispense Refill  . albuterol (VENTOLIN HFA) 108 (90 Base) MCG/ACT inhaler INHALE TWO PUFFS INTO LUNGS EVERY 6 HOURS AS NEEDED FOR WHEEZING 18 each 1  . amLODipine (NORVASC) 5 MG tablet Take 1 tablet (5 mg total) by mouth daily. 90 tablet 0  . cetirizine (ZYRTEC) 10 MG tablet Take 10 mg by mouth daily. Reported on 02/27/2016    . Multiple Vitamins-Minerals (MULTIVITAMIN WITH MINERALS) tablet Take 1 tablet by mouth daily.    . valACYclovir (VALTREX) 500 MG tablet Daily  forprevention, 4 tablets BID x 1 day for flare up 100 tablet 3   No current facility-administered medications on file prior to visit.    ROS as in subjective   Objective: BP 120/80   Pulse 92   Temp 98.3 F (36.8 C) (Oral)   Resp 16   Ht 5\' 2"  (1.575 m)   Wt 230 lb 3.2 oz  (104.4 kg)   LMP 01/11/2019 (Exact Date)   SpO2 98%   BMI 42.10 kg/m   Gen: wd, wn, nad, AA female, obese Skin: Left second toe distal dorsal phalnx medial portion with 4mm diameter brown flat macule Left upper arm just distal to shoudler anteriorly with 75mm x 79mm brown flat somewhat trapezoidal macule Right 4th finger lateral surface of middle phalanx 62mm diamter brown flat macule Left forearm latearl 1/3 of arm with 48mm brown round, slighlty raised macule, unchnaged per patient, tattoo left upper lateral arm Lungs clear Heart rrr, normal s1, s2, no murmurs Ext:no edema Pulses 2+ UE and LE    Assessment: Encounter Diagnoses  Name Primary?  . Essential hypertension Yes  . Mild intermittent asthma without complication   . PCOS (polycystic ovarian syndrome)   . Gastroesophageal reflux disease without esophagitis   . Genital herpes simplex, unspecified site   . Obesity with serious comorbidity, unspecified classification, unspecified obesity type   . Screening for diabetes mellitus   . Skin lesion     Plan: Hypertension-continue current medication, baseline labs today  Asthma - mild intermittent, no particular concerns  PCOS-managed by gynecology  Obesity-counseled on diet, exercise, strategies to lose weight  Skin lesions-we discussed taking pictures of the current lesions and monitoring them.  No particular worrisome ones today     Allison Wagner was seen today for hypertension.  Diagnoses and all orders for this visit:  Essential hypertension -     Basic metabolic panel  Mild intermittent asthma without complication  PCOS (polycystic ovarian syndrome)  Gastroesophageal reflux disease without esophagitis  Genital herpes simplex, unspecified site  Obesity with serious comorbidity, unspecified classification, unspecified obesity type -     Hemoglobin A1c  Screening for diabetes mellitus -     Hemoglobin A1c  Skin lesion

## 2019-01-21 NOTE — Patient Instructions (Addendum)
Thanks for trusting us with your health care and for coming in for a physical today.  Below are some general recommendations I have for you:  Yearly screenings See your eye doctor yearly for routine vision care. See your dentist yearly for routine dental care including hygiene visits twice yearly. See me here yearly for a routine physical and preventative care visit   EYE DOCTOR RECOMMENDATIONS  [facility] [address], [place], [place] [address] Phone: [phone] [email or web address]   [facility] Dr. Gelene Minkimothy Koop [address], [address] [place], [place] [address]  [phone] [email or web address]   Vincenza HewsSigmund S. Gould, M.D. Susanne GreenhouseJason A. Gould, O.D. [address] [place], [place] [address] Medical telephone: [phone] Optical telephone: [phone]) [phone]   Dr. Glenford PeersHoward McFarland [address], [place], [place] [phone]   Cancer screening Get your pap smear through gyencology Check breasts monthly for lumps Do skin surveillance at home regularly   Specific Concerns today:  . We will check some labs today     Please follow up yearly for a physical.    I have included other useful information below for your review.  Preventative Care for Adults - Female      MAINTAIN REGULAR HEALTH EXAMS:  A routine yearly physical is a good way to check in with your primary care provider about your health and preventive screening. It is also an opportunity to share updates about your health and any concerns you have, and receive a thorough all-over exam.   Most health insurance companies pay for at least some preventative services.  Check with your health plan for specific coverages.  WHAT PREVENTATIVE SERVICES DO WOMEN NEED?  Adult women should have their weight and blood pressure checked regularly.   Women age 34 and older should have their cholesterol levels checked regularly.  Women should  be screened for cervical cancer with a Pap smear and pelvic exam beginning at either age 34, or 3 years after they become sexually activity.    Breast cancer screening generally begins at age 34 with a mammogram and breast exam by your primary care provider.    Beginning at age 34 and continuing to age 34, women should be screened for colorectal cancer.  Certain people may need continued testing until age 34.  Updating vaccinations is part of preventative care.  Vaccinations help protect against diseases such as the flu.  Osteoporosis is a disease in which the bones lose minerals and strength as we age. Women ages 7265 and over should discuss this with their caregivers, as should women after menopause who have other risk factors.  Lab tests are generally done as part of preventative care to screen for anemia and blood disorders, to screen for problems with the kidneys and liver, to screen for bladder problems, to check blood sugar, and to check your cholesterol level.  Preventative services generally include counseling about diet, exercise, avoiding tobacco, drugs, excessive alcohol consumption, and sexually transmitted infections.    GENERAL RECOMMENDATIONS FOR GOOD HEALTH:  Healthy diet:  Eat a variety of foods, including fruit, vegetables, animal or vegetable protein, such as meat, fish, chicken, and eggs, or beans, lentils, tofu, and grains, such as rice.  Drink plenty of water daily.  Decrease saturated fat in the diet, avoid lots of red meat, processed foods, sweets, fast foods, and fried foods.  Exercise:  Aerobic exercise helps maintain good heart health. At least 30-40 minutes of moderate-intensity exercise is recommended. For  example, a brisk walk that increases your heart rate and breathing. This should be done on most days of the week.   Find a type of exercise or a variety of exercises that you enjoy so that it becomes a part of your daily life.  Examples are running, walking,  swimming, water aerobics, and biking.  For motivation and support, explore group exercise such as aerobic class, spin class, Zumba, Yoga,or  martial arts, etc.    Set exercise goals for yourself, such as a certain weight goal, walk or run in a race such as a 5k walk/run.  Speak to your primary care provider about exercise goals.  Disease prevention:  If you smoke or chew tobacco, find out from your caregiver how to quit. It can literally save your life, no matter how long you have been a tobacco user. If you do not use tobacco, never begin.   Maintain a healthy diet and normal weight. Increased weight leads to problems with blood pressure and diabetes.   The Body Mass Index or BMI is a way of measuring how much of your body is fat. Having a BMI above 27 increases the risk of heart disease, diabetes, hypertension, stroke and other problems related to obesity. Your caregiver can help determine your BMI and based on it develop an exercise and dietary program to help you achieve or maintain this important measurement at a healthful level.  High blood pressure causes heart and blood vessel problems.  Persistent high blood pressure should be treated with medicine if weight loss and exercise do not work.   Fat and cholesterol leaves deposits in your arteries that can block them. This causes heart disease and vessel disease elsewhere in your body.  If your cholesterol is found to be high, or if you have heart disease or certain other medical conditions, then you may need to have your cholesterol monitored frequently and be treated with medication.   Ask if you should have a cardiac stress test if your history suggests this. A stress test is a test done on a treadmill that looks for heart disease. This test can find disease prior to there being a problem.  Menopause can be associated with physical symptoms and risks. Hormone replacement therapy is available to decrease these. You should talk to your  caregiver about whether starting or continuing to take hormones is right for you.   Osteoporosis is a disease in which the bones lose minerals and strength as we age. This can result in serious bone fractures. Risk of osteoporosis can be identified using a bone density scan. Women ages 4465 and over should discuss this with their caregivers, as should women after menopause who have other risk factors. Ask your caregiver whether you should be taking a calcium supplement and Vitamin D, to reduce the rate of osteoporosis.   Avoid drinking alcohol in excess (more than two drinks per day).  Avoid use of street drugs. Do not share needles with anyone. Ask for professional help if you need assistance or instructions on stopping the use of alcohol, cigarettes, and/or drugs.  Brush your teeth twice a day with fluoride toothpaste, and floss once a day. Good oral hygiene prevents tooth decay and gum disease. The problems can be painful, unattractive, and can cause other health problems. Visit your dentist for a routine oral and dental check up and preventive care every 6-12 months.   Look at your skin regularly.  Use a mirror to look at your back. Notify  your caregivers of changes in moles, especially if there are changes in shapes, colors, a size larger than a pencil eraser, an irregular border, or development of new moles.  Safety:  Use seatbelts 100% of the time, whether driving or as a passenger.  Use safety devices such as hearing protection if you work in environments with loud noise or significant background noise.  Use safety glasses when doing any work that could send debris in to the eyes.  Use a helmet if you ride a bike or motorcycle.  Use appropriate safety gear for contact sports.  Talk to your caregiver about gun safety.  Use sunscreen with a SPF (or skin protection factor) of 15 or greater.  Lighter skinned people are at a greater risk of skin cancer. Don't forget to also wear sunglasses in order to  protect your eyes from too much damaging sunlight. Damaging sunlight can accelerate cataract formation.   Practice safe sex. Use condoms. Condoms are used for birth control and to help reduce the spread of sexually transmitted infections (or STIs).  Some of the STIs are gonorrhea (the clap), chlamydia, syphilis, trichomonas, herpes, HPV (human papilloma virus) and HIV (human immunodeficiency virus) which causes AIDS. The herpes, HIV and HPV are viral illnesses that have no cure. These can result in disability, cancer and death.   Keep carbon monoxide and smoke detectors in your home functioning at all times. Change the batteries every 6 months or use a model that plugs into the wall.   Vaccinations:  Stay up to date with your tetanus shots and other required immunizations. You should have a booster for tetanus every 10 years. Be sure to get your flu shot every year, since 5%-20% of the [place] population comes down with the flu. The flu vaccine changes each year, so being vaccinated once is not enough. Get your shot in the fall, before the flu season peaks.   Other vaccines to consider:  Human Papilloma Virus or HPV causes cancer of the cervix, and other infections that can be transmitted from person to person. There is a vaccine for HPV, and females should get immunized between the ages of 11 and 7626. It 30requires a series of 3 shots.   Pneumococcal vaccine to protect against certain types of pneumonia.  This is normally recommended for adults age 34 or older.  However, adults younger than 34 years old with certain underlying conditions such as diabetes, heart or lung disease should also receive the vaccine.  Shingles vaccine to protect against Varicella Zoster if you are older than age 34, or younger than 34 years old with certain underlying illness.  If you have not had the Shingrix vaccine, please call your insurer to inquire about coverage for the Shingrix vaccine given in 2 doses.   Some insurers  cover this vaccine after age 34, some cover this after age 34.  If your insurer covers this, then call to schedule appointment to have this vaccine here  Hepatitis A vaccine to protect against a form of infection of the liver by a virus acquired from food.  Hepatitis B vaccine to protect against a form of infection of the liver by a virus acquired from blood or body fluids, particularly if you work in health care.  If you plan to travel internationally, check with your local health department for specific vaccination recommendations.  Cancer Screening:  Breast cancer screening is essential to preventive care for women. All women age 34 and older should perform a breast  self-exam every month. At age 20 and older, women should have their caregiver complete a breast exam each year. Women at ages 77 and older should have a mammogram (x-ray film) of the breasts. Your caregiver can discuss how often you need mammograms.    Cervical cancer screening includes taking a Pap smear (sample of cells examined under a microscope) from the cervix (end of the uterus). It also includes testing for HPV (Human Papilloma Virus, which can cause cervical cancer). Screening and a pelvic exam should begin at age 7, or 3 years after a woman becomes sexually active. Screening should occur every year, with a Pap smear but no HPV testing, up to age 69. After age 7, you should have a Pap smear every 3 years with HPV testing, if no HPV was found previously.   Most routine colon cancer screening begins at the age of 35. On a yearly basis, doctors may provide special easy to use take-home tests to check for hidden blood in the stool. Sigmoidoscopy or colonoscopy can detect the earliest forms of colon cancer and is life saving. These tests use a small camera at the end of a tube to directly examine the colon. Speak to your caregiver about this at age 26, when routine screening begins (and is repeated every 5 years unless early forms  of pre-cancerous polyps or small growths are found).

## 2019-01-22 ENCOUNTER — Other Ambulatory Visit: Payer: Self-pay | Admitting: Medical

## 2019-01-22 MED ORDER — AMLODIPINE BESYLATE 5 MG PO TABS: 5.0000 mg | ORAL_TABLET | Freq: Every day | ORAL | 3 refills | Status: DC

## 2019-01-22 MED ORDER — VALACYCLOVIR HCL 500 MG PO TABS: ORAL_TABLET | ORAL | 11 refills | Status: DC

## 2019-08-18 ENCOUNTER — Other Ambulatory Visit: Payer: Self-pay | Admitting: Cardiology

## 2019-08-18 DIAGNOSIS — Z20822 Contact with and (suspected) exposure to covid-19: Secondary | ICD-10-CM

## 2019-08-19 LAB — NOVEL CORONAVIRUS, NAA: SARS-CoV-2, NAA: NOT DETECTED

## 2019-09-24 ENCOUNTER — Other Ambulatory Visit: Payer: Self-pay

## 2019-09-24 ENCOUNTER — Telehealth: Payer: Self-pay | Admitting: Medical

## 2019-09-24 MED ORDER — AMLODIPINE BESYLATE 5 MG PO TABS: 5.0000 mg | ORAL_TABLET | Freq: Every day | ORAL | 3 refills | Status: DC

## 2019-09-24 NOTE — Telephone Encounter (Signed)
Medication sent.

## 2019-09-24 NOTE — Telephone Encounter (Signed)
Pt called and is requesting that her amlodipine [facility] to the [facility] [address], [place], [place] [address]

## 2019-11-24 LAB — HM PAP SMEAR: HM Pap smear: NORMAL

## 2019-11-24 LAB — RESULTS CONSOLE HPV: CHL HPV: NEGATIVE

## 2019-11-29 ENCOUNTER — Telehealth: Payer: Self-pay | Admitting: Medical

## 2019-11-29 NOTE — Telephone Encounter (Signed)
Pt called and said her ankles and feet have been swelling on and off. She stated she had this issue at her visit with you last year. Pt advised that she may need to come in for a visit but wanted to see if she could get a prescription instead.

## 2019-11-29 NOTE — Telephone Encounter (Signed)
Ok, that sounds fine.

## 2019-11-29 NOTE — Telephone Encounter (Signed)
Pt is not due for physical until June so she wanted to come in May to get her ankles addressed

## 2019-11-29 NOTE — Telephone Encounter (Signed)
Lets get her in for a fasting physical visit and to discuss the swelling further.  In general we recommend exercise.  For swelling in the legs, we also recommend compression hose if this is a daily issue, particularly if it gets worse as the day goes on.

## 2019-12-14 ENCOUNTER — Encounter: Payer: Self-pay | Admitting: Medical

## 2019-12-14 ENCOUNTER — Other Ambulatory Visit: Payer: Self-pay

## 2019-12-14 ENCOUNTER — Telehealth: Payer: Self-pay | Admitting: Medical

## 2019-12-14 ENCOUNTER — Ambulatory Visit (INDEPENDENT_AMBULATORY_CARE_PROVIDER_SITE_OTHER): Payer: No Typology Code available for payment source | Admitting: Medical

## 2019-12-14 VITALS — BP 134/68 | HR 68 | Temp 98.9°F | Ht 62.0 in | Wt 226.8 lb

## 2019-12-14 DIAGNOSIS — M25472 Effusion, left ankle: Secondary | ICD-10-CM | POA: Diagnosis not present

## 2019-12-14 DIAGNOSIS — T50905A Adverse effect of unspecified drugs, medicaments and biological substances, initial encounter: Secondary | ICD-10-CM | POA: Diagnosis not present

## 2019-12-14 DIAGNOSIS — M205X1 Other deformities of toe(s) (acquired), right foot: Secondary | ICD-10-CM | POA: Diagnosis not present

## 2019-12-14 DIAGNOSIS — E049 Nontoxic goiter, unspecified: Secondary | ICD-10-CM

## 2019-12-14 DIAGNOSIS — L608 Other nail disorders: Secondary | ICD-10-CM

## 2019-12-14 DIAGNOSIS — I10 Essential (primary) hypertension: Secondary | ICD-10-CM | POA: Diagnosis not present

## 2019-12-14 DIAGNOSIS — L729 Follicular cyst of the skin and subcutaneous tissue, unspecified: Secondary | ICD-10-CM

## 2019-12-14 MED ORDER — SPIRONOLACTONE 25 MG PO TABS: 25.0000 mg | ORAL_TABLET | Freq: Every day | ORAL | 2 refills | Status: DC

## 2019-12-14 NOTE — Progress Notes (Signed)
Subjective: Chief Complaint  Patient presents with  . Joint Swelling    left- swells when sitting  . Cyst    back of neck left side    Here for multiple concerns  She notes a possible cyst on the back of her neck has been unchanged for several months.  No drainage no warmth no pain  Right fourth and fifth toes crossed over each other at times  Concerned about right toenails throughout looking darker than left but no thickening or other deformity  Been having some swelling in left ankle for months, worse when sitting for prolonged periods.  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  But she also has a part-time job working at [employer] her she is on her feet for long hours.  She does not want to wear compression hose.  No calf pain.  No calf swelling.  No chest pain or shortness of breath  She may have plans to get pregnant.  She is talking to her gynecologist about this and will see them again in June.    She has a history of PCOS and increased hair growth in unwanted areas   Past Medical History:  Diagnosis Date  . Asthma   . Dysphagia 2010   hospitalization, diagnosed with GERD and enlarged tonsils  . GERD (gastroesophageal reflux disease)   . Herpes genitalia    type 1  . Hx of blood clots    vaginal with menstruation  . Polycystic ovarian syndrome 1995  . Routine gynecological examination    Dr. Leo Grosser  . Seasonal allergic rhinitis   . Trichomonas   . Yeast infection    Current Outpatient Medications on File Prior to Visit  Medication Sig Dispense Refill  . albuterol (VENTOLIN HFA) 108 (90 Base) MCG/ACT inhaler INHALE TWO PUFFS INTO LUNGS EVERY 6 HOURS AS NEEDED FOR WHEEZING 18 each 1  . cetirizine (ZYRTEC) 10 MG tablet Take 10 mg by mouth daily. Reported on 02/27/2016    . etonogestrel-ethinyl estradiol (NUVARING) 0.12-0.015 MG/24HR vaginal ring NuvaRing 0.12 mg-0.015 mg/24 hr vaginal  INSERT 1 RING VAGINALLY FOR 3 WEEKS, THEN REMOVE FOR 1  WEEK    . Multiple Vitamins-Minerals (MULTIVITAMIN WITH MINERALS) tablet Take 1 tablet by mouth daily.    . valACYclovir (VALTREX) 500 MG tablet Daily  forprevention, 4 tablets BID x 1 day for flare up 100 tablet 11   No current facility-administered medications on file prior to visit.   ROS as in subjective    Objective: BP 134/68   Pulse 68   Temp 98.9 F (37.2 C)   Ht 5\' 2"  (1.575 m)   Wt 226 lb 12.8 oz (102.9 kg)   SpO2 98%   BMI 41.48 kg/m   Wt Readings from Last 3 Encounters:  12/14/19 226 lb 12.8 oz (102.9 kg)  01/21/19 230 lb 3.2 oz (104.4 kg)  10/21/18 226 lb 6.4 oz (102.7 kg)   BP Readings from Last 3 Encounters:  12/14/19 134/68  01/21/19 120/80  10/21/18 (!) 140/92   Gen: wd, wn, nad, African-American female Neck mild generalized goiter, no specific nodule, no lymphadenopathy, no other mass, left posterior neck with small sub centimeter nodule that seems more superficial suggestive of sebaceous cyst without fluctuance erythema or drainage Lungs clear  Heart regular rate and rhythm, normal S1-S2 Right fourth toe slightly crosses over fifth toe Right toe nails are slightly darker than the left but no obvious thickening or deformity otherwise.  There is white toenail polish on most of her toes making exam somewhat limited Left ankle with puffy edema otherwise no obvious edema or asymmetry of the legs, legs nontender, normal range of motion without deformity otherwise Legs neurovascularly intact     Assessment: Encounter Diagnoses  Name Primary?  . Essential hypertension Yes  . Left ankle swelling   . Adverse effect of drug, initial encounter   . Crossover toe deformity of right foot   . Cyst of skin   . Goiter   . Toenail deformity      Plan: Hypertension-stop amlodipine due to adverse effect and change to Aldactone / spironolactone.  Advise she follow-up with gynecology in June to discuss medications and discuss her concerns about may be willing to  get pregnant.  Recheck in 1 month here on blood pressure  Left ankle swelling -likely due to both prolonged sitting during the day and amlodipine adverse effect.  She wants to stop amlodipine.  Discussed importance of exercise, consider compression hose.  No other obvious cause of ankle swelling  We will stop amlodipine due to ankle edema limited  Toe deformity, crossover-discussed over-the-counter splints, wearing supportive shoes with good arch support, but if worsening consider podiatry referral  Cyst of skin back of neck-we discussed the finding that appears to be a cyst vs  less likely lymph node .  small sebaceous cyst most likely, reassured.  We discussed follow-up if it gets bigger or starts to get swollen or painful  Goiter-refer for ultrasound, she notes recent normal thyroid labs through gynecology  Geraldean was seen today for joint swelling and cyst.  Diagnoses and all orders for this visit:  Essential hypertension  Left ankle swelling  Adverse effect of drug, initial encounter  Crossover toe deformity of right foot  Cyst of skin  Goiter -     US THYROID; Future  Toenail deformity  Other orders -     spironolactone (ALDACTONE) 25 MG tablet; Take 1 tablet (25 mg total) by mouth daily.   Follow-up in 1 month for physical

## 2019-12-14 NOTE — Telephone Encounter (Signed)
Please schedule thyroid ultrasound for goiter

## 2019-12-17 ENCOUNTER — Other Ambulatory Visit: Payer: Self-pay | Admitting: Medical

## 2019-12-17 MED ORDER — HYDROCHLOROTHIAZIDE 12.5 MG PO TABS: 12.5000 mg | ORAL_TABLET | Freq: Every day | ORAL | 0 refills | Status: DC

## 2019-12-22 NOTE — Telephone Encounter (Signed)
Has this been handled?

## 2019-12-23 ENCOUNTER — Telehealth: Payer: Self-pay

## 2019-12-23 ENCOUNTER — Other Ambulatory Visit: Payer: Self-pay | Admitting: Medical

## 2019-12-23 MED ORDER — VALACYCLOVIR HCL 500 MG PO TABS: ORAL_TABLET | ORAL | 0 refills | Status: DC

## 2019-12-23 NOTE — Telephone Encounter (Signed)
Pt. Called stating that she needs a refill on her Valtrex sent in to the [facility] on [address] in [place]. LAST APT WAS 12/14/19.

## 2019-12-23 NOTE — Telephone Encounter (Signed)
I left at 2pm today to go out of town.   I am just now seeing this at 9:30pm.  Something more urgent should have been called out earlier, or someone could have called me.    By the time you see this message, she will be on a plane.     Try to contact her tomorrow and call in Valtrex somewhere please #[phone], no refill.

## 2019-12-23 NOTE — Telephone Encounter (Signed)
Patient stated she would call imaging back. They called her and she wasn't sure what time she could go but she will call them back when she can.

## 2019-12-23 NOTE — Telephone Encounter (Signed)
Pt called back and is needing this RX before she goes out of town in the morning she is getting on a plan at 4 in the morning,

## 2019-12-24 NOTE — Telephone Encounter (Signed)
Pt. Said she will just pick up the prescription when she is back in town should be fine with out for a few days.

## 2019-12-27 ENCOUNTER — Other Ambulatory Visit: Payer: Self-pay | Admitting: Medical

## 2020-01-20 ENCOUNTER — Other Ambulatory Visit: Payer: Self-pay | Admitting: Medical

## 2020-01-24 ENCOUNTER — Ambulatory Visit
Admission: RE | Admit: 2020-01-24 | Discharge: 2020-01-24 | Disposition: A | Discharge status: Home / Self Care | Payer: 59 | Source: Ambulatory Visit | Attending: Medical | Admitting: Medical

## 2020-01-24 DIAGNOSIS — E049 Nontoxic goiter, unspecified: Secondary | ICD-10-CM

## 2020-02-17 ENCOUNTER — Encounter: Payer: Self-pay | Admitting: Medical

## 2020-02-17 ENCOUNTER — Ambulatory Visit (INDEPENDENT_AMBULATORY_CARE_PROVIDER_SITE_OTHER): Payer: No Typology Code available for payment source | Admitting: Medical

## 2020-02-17 ENCOUNTER — Other Ambulatory Visit: Payer: Self-pay

## 2020-02-17 VITALS — BP 112/82 | HR 80 | Temp 98.7°F | Ht 62.0 in | Wt 229.6 lb

## 2020-02-17 DIAGNOSIS — J4520 Mild intermittent asthma, uncomplicated: Secondary | ICD-10-CM

## 2020-02-17 DIAGNOSIS — L729 Follicular cyst of the skin and subcutaneous tissue, unspecified: Secondary | ICD-10-CM | POA: Diagnosis not present

## 2020-02-17 DIAGNOSIS — R05 Cough: Secondary | ICD-10-CM

## 2020-02-17 DIAGNOSIS — R059 Cough, unspecified: Secondary | ICD-10-CM

## 2020-02-17 DIAGNOSIS — J988 Other specified respiratory disorders: Secondary | ICD-10-CM

## 2020-02-17 MED ORDER — BENZONATATE 200 MG PO CAPS
200.0000 mg | ORAL_CAPSULE | Freq: Three times a day (TID) | ORAL | 0 refills | Status: DC | PRN
Start: 2020-02-17 — End: 2020-11-23

## 2020-02-17 MED ORDER — AMOXICILLIN 875 MG PO TABS
875.0000 mg | ORAL_TABLET | Freq: Two times a day (BID) | ORAL | 0 refills | Status: DC
Start: 2020-02-17 — End: 2020-11-23

## 2020-02-17 MED ORDER — FLUCONAZOLE 150 MG PO TABS
150.0000 mg | ORAL_TABLET | Freq: Once | ORAL | 0 refills | Status: AC
Start: 2020-02-17 — End: 2020-02-17

## 2020-02-17 MED ORDER — ALBUTEROL SULFATE HFA 108 (90 BASE) MCG/ACT IN AERS
2.0000 | INHALATION_SPRAY | Freq: Four times a day (QID) | RESPIRATORY_TRACT | 0 refills | Status: DC | PRN
Start: 2020-02-17 — End: 2020-11-23

## 2020-02-17 NOTE — Progress Notes (Signed)
Subjective: Chief Complaint  Patient presents with  . Cough    with congestion   . Cyst    back of neck for a few month-bigger and painful    Here for cough, intermittent for about 2 weeks.  Had fever but that resolved.   Had sore throat but that resolved.  Yesterday had some chest congestion/discomfort. Been having some headaches as well this past week or so.  She notes having covid test 02/05/20, negative.  Still has ongoing congestion and cough not improving with allergy pill or sudafed.  No wheezing , no SOB but does have hx/o asthma.  Last time she was here she had a cyst on her neck she was worried about.  The cyst seems to be bigger and causing some discomfort.  Was red and more tender but has calmed down a little.  No drainage.    No other aggravating or relieving factors. No other complaint.   Past Medical History:  Diagnosis Date  . Asthma   . Dysphagia 2010   hospitalization, diagnosed with GERD and enlarged tonsils  . GERD (gastroesophageal reflux disease)   . Herpes genitalia    type 1  . Hx of blood clots    vaginal with menstruation  . Polycystic ovarian syndrome 1995  . Routine gynecological examination    Dr. Pennie Rushing  . Seasonal allergic rhinitis   . Trichomonas   . Yeast infection    Current Outpatient Medications on File Prior to Visit  Medication Sig Dispense Refill  . albuterol (VENTOLIN HFA) 108 (90 Base) MCG/ACT inhaler INHALE TWO PUFFS INTO LUNGS EVERY 6 HOURS AS NEEDED FOR WHEEZING 18 each 1  . cetirizine (ZYRTEC) 10 MG tablet Take 10 mg by mouth daily. Reported on 02/27/2016    . etonogestrel-ethinyl estradiol (NUVARING) 0.12-0.015 MG/24HR vaginal ring NuvaRing 0.12 mg-0.015 mg/24 hr vaginal  INSERT 1 RING VAGINALLY FOR 3 WEEKS, THEN REMOVE FOR 1 WEEK    . hydrochlorothiazide (HYDRODIURIL) 12.5 MG tablet TAKE 1 TABLET BY MOUTH EVERY DAY 30 tablet 0  . Multiple Vitamins-Minerals (MULTIVITAMIN WITH MINERALS) tablet Take 1 tablet by mouth daily.    Marland Kitchen  amLODipine (NORVASC) 5 MG tablet Take 5 mg by mouth daily.    . valACYclovir (VALTREX) 500 MG tablet Daily  forprevention, 4 tablets BID x 1 day for flare up (Patient not taking: Reported on 02/17/2020) 30 tablet 0   No current facility-administered medications on file prior to visit.   ROS as in subjective    Objective: BP 112/82   Pulse 80   Temp 98.7 F (37.1 C)   Ht 5\' 2"  (1.575 m)   Wt 229 lb 9.6 oz (104.1 kg)   SpO2 97%   BMI 41.99 kg/m   Wt Readings from Last 3 Encounters:  02/17/20 229 lb 9.6 oz (104.1 kg)  12/14/19 226 lb 12.8 oz (102.9 kg)  01/21/19 230 lb 3.2 oz (104.4 kg)   BP Readings from Last 3 Encounters:  02/17/20 112/82  12/14/19 134/68  01/21/19 120/80   Gen: wd, wn, nad, African-American female Neck soft, nontender anteriorly but left posterior neck with 2.5 cm tender raised cystic lesion without induration fluctuance or redness or warmth, no other mass or lymphadenopathy or thyromegaly Lungs clear  Heart regular rate and rhythm, normal S1-S2 HEENT unremarkable, pharynx normal, TMs pearly    Assessment: Encounter Diagnoses  Name Primary?  . Cough Yes  . Respiratory tract infection   . Cyst of skin   . Mild intermittent  asthma without complication      Plan: We discussed her findings and concerns.  We discussed the recommendations below that was also given a handout  Patient Instructions  Cough/congestion  Hydrate well with water  You can use the Tessalon Perles cough drops up to 3 times daily  You can continue allergy pill or Mucinex for the next 5 days  Use the Albuterol inhaler, 2 puffs 2 or 3 times daily for cough fits, shortness of breath or wheezing  Begin Amoxicillin antibiotic, twice daily for 10 days  If not much improved within the next week, then call back   In the event of vaginal itching/redness suggestive of yeast infection:  Use the Diflucan yeast infection pill, 1 tablet once, and you can repeat in 1 week if  needed   Cyst of neck  The amoxicillin is also to help reduce the inflammation or possible risk of infection of cyst of neck  Use warm compresses 20 minutes on , 20 minutes off the next 3-5 days  If the cyst worseness, doesn't get better, or just continues to be a problem, then let me know and we will refer to dermatology      Allison Wagner was seen today for cough and cyst.  Diagnoses and all orders for this visit:  Cough  Respiratory tract infection  Cyst of skin  Mild intermittent asthma without complication  Other orders -     amoxicillin (AMOXIL) 875 MG tablet; Take 1 tablet (875 mg total) by mouth 2 (two) times daily. -     benzonatate (TESSALON) 200 MG capsule; Take 1 capsule (200 mg total) by mouth 3 (three) times daily as needed for cough. -     fluconazole (DIFLUCAN) 150 MG tablet; Take 1 tablet (150 mg total) by mouth once for 1 dose. Can repeat in 1 week -     albuterol (VENTOLIN HFA) 108 (90 Base) MCG/ACT inhaler; Inhale 2 puffs into the lungs every 6 (six) hours as needed for wheezing or shortness of breath.   Follow-up prn

## 2020-02-17 NOTE — Patient Instructions (Addendum)
Cough/congestion  Hydrate well with water  You can use the Tessalon Perles cough drops up to 3 times daily  You can continue allergy pill or Mucinex for the next 5 days  Use the Albuterol inhaler, 2 puffs 2 or 3 times daily for cough fits, shortness of breath or wheezing  Begin Amoxicillin antibiotic, twice daily for 10 days  If not much improved within the next week, then call back   In the event of vaginal itching/redness suggestive of yeast infection:  Use the Diflucan yeast infection pill, 1 tablet once, and you can repeat in 1 week if needed   Cyst of neck  The amoxicillin is also to help reduce the inflammation or possible risk of infection of cyst of neck  Use warm compresses 20 minutes on , 20 minutes off the next 3-5 days  If the cyst worseness, doesn't get better, or just continues to be a problem, then let me know and we will refer to dermatology

## 2020-04-24 ENCOUNTER — Other Ambulatory Visit: Payer: Self-pay | Admitting: Medical

## 2020-07-08 ENCOUNTER — Other Ambulatory Visit: Payer: Self-pay | Admitting: Medical

## 2020-08-08 ENCOUNTER — Ambulatory Visit (INDEPENDENT_AMBULATORY_CARE_PROVIDER_SITE_OTHER): Payer: Self-pay | Admitting: Family Medicine

## 2020-08-08 ENCOUNTER — Other Ambulatory Visit: Payer: Self-pay

## 2020-08-08 ENCOUNTER — Encounter: Payer: Self-pay | Admitting: Family Medicine

## 2020-08-08 VITALS — BP 120/78 | HR 62 | Temp 98.6°F | Wt 239.6 lb

## 2020-08-08 DIAGNOSIS — E282 Polycystic ovarian syndrome: Secondary | ICD-10-CM

## 2020-08-08 DIAGNOSIS — N898 Other specified noninflammatory disorders of vagina: Secondary | ICD-10-CM

## 2020-08-08 NOTE — Progress Notes (Signed)
   Subjective:    Patient ID: Allison Wagner, female    DOB: November 05, 1985, 34 y.o.   MRN: [id number]  HPI She complains of noting a slight watery vaginal discharge several days ago but none since then.  Last sexual contact was about 3 weeks ago.  She does have PCOS and apparently will be having her next cycle in early January.   Review of Systems     Objective:   Physical Exam Vaginal exam shows the tissue to be totally normal.  No discharge noted.  KOH and wet prep were negative.       Assessment & Plan:  PCOS (polycystic ovarian syndrome)  Vaginal discharge I explained that since I found nothing, I do not think any treatment is needed.  She was comfortable with that.

## 2020-08-09 ENCOUNTER — Ambulatory Visit: Payer: No Typology Code available for payment source | Admitting: Medical

## 2020-09-07 ENCOUNTER — Telehealth: Payer: Self-pay

## 2020-09-07 NOTE — Telephone Encounter (Signed)
Pt states she is still having discharge since her visit with Dr. Susann Givens and now it's grey and thinks she has bacterial vaginosis.  Has odor and slight itching and would like to be treated.  Would like it called into different pharmacy [facility] [address] [facility] [place]

## 2020-09-08 NOTE — Telephone Encounter (Signed)
Obviously I did not see her for that visit so I do not know what the story was or what her concerns were?  I would recommend an appointment to reevaluate, recheck swabs in person, or we can do video if she refuses as I need to know more information such as any concern for STD, what has she used to help with symptoms, when the symptoms started was there a change in hygiene products or other reasons to have discharge?  Any concern about pregnancy?  This is why we do appointments as there are a lot of questions that need answered

## 2020-09-08 NOTE — Telephone Encounter (Signed)
Patient has been informed.

## 2020-11-22 IMAGING — US US THYROID
1 series · 14 of 25 positions shown · non-contrast
Comparison: None.

CLINICAL DATA: Thyroid goiter by exam

EXAM:
THYROID ULTRASOUND
TECHNIQUE: Ultrasound examination of the thyroid gland and adjacent soft
tissues was performed.

[Series 1: us thyroid · 0.08mm/px · 14 of 34 slices shown]
[im 1/34]
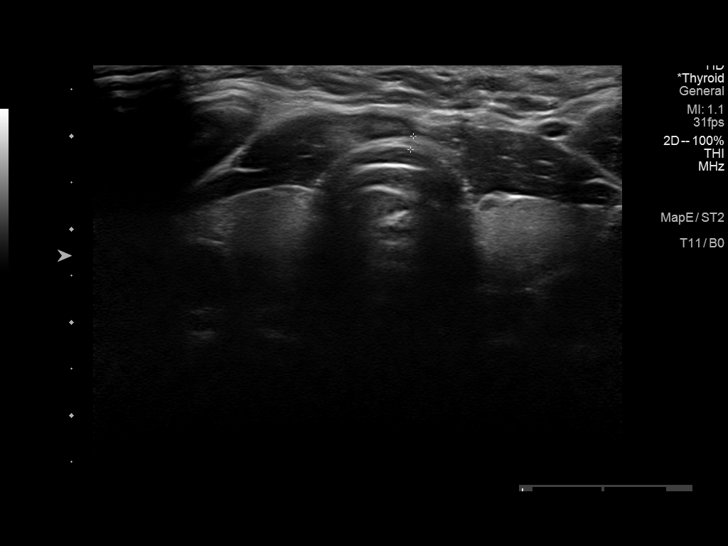
[im 3/34]
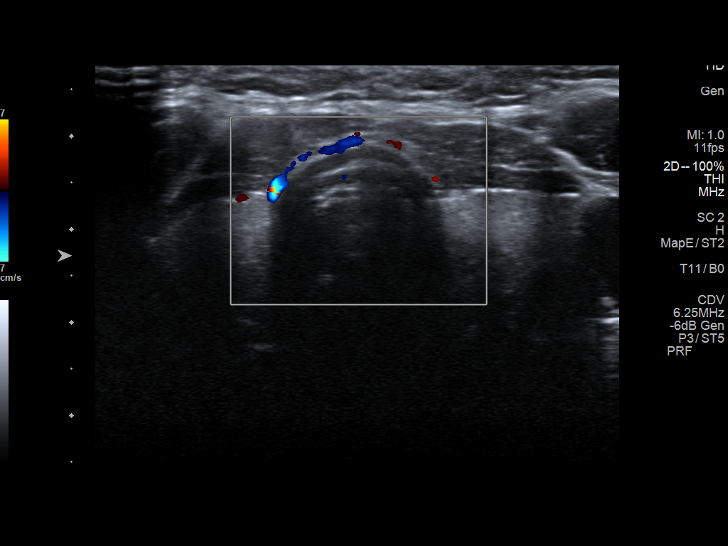
[im 6/34]
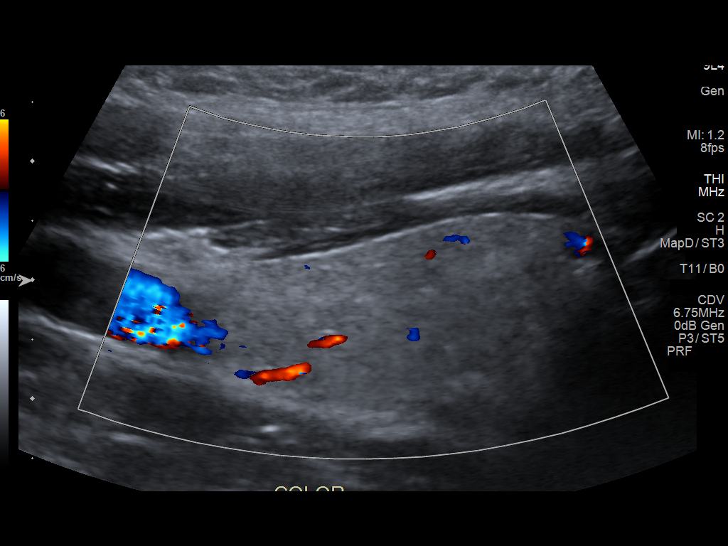
[im 9/34]
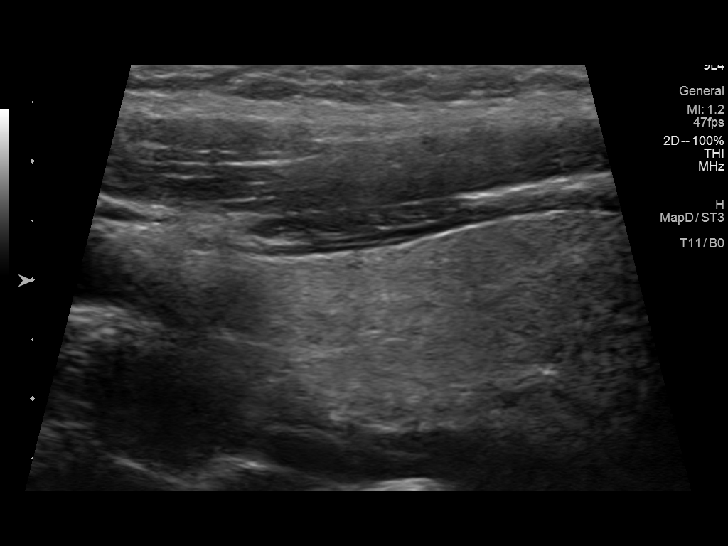
[im 12/34]
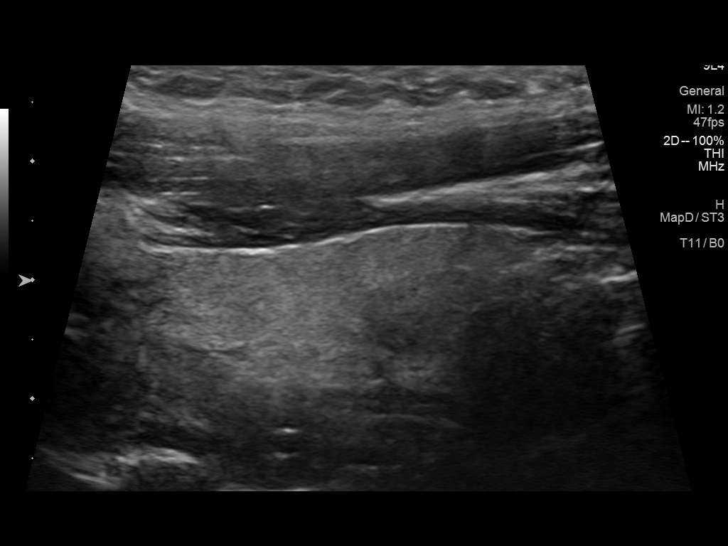
[im 13/34]
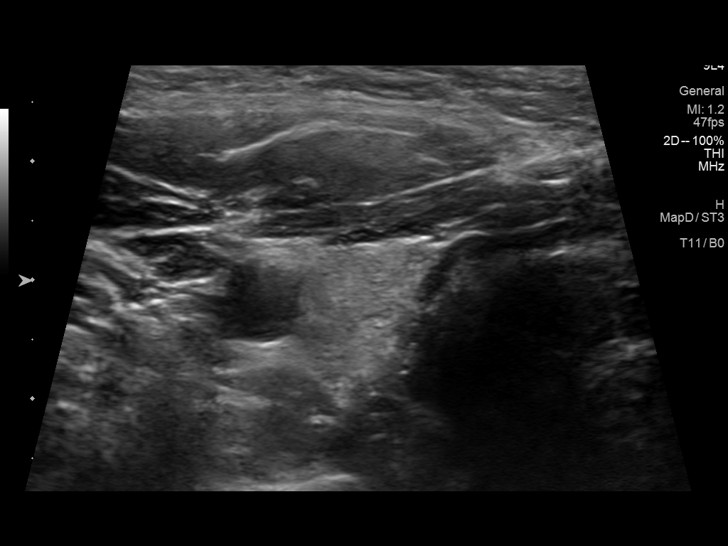
[im 16/34]
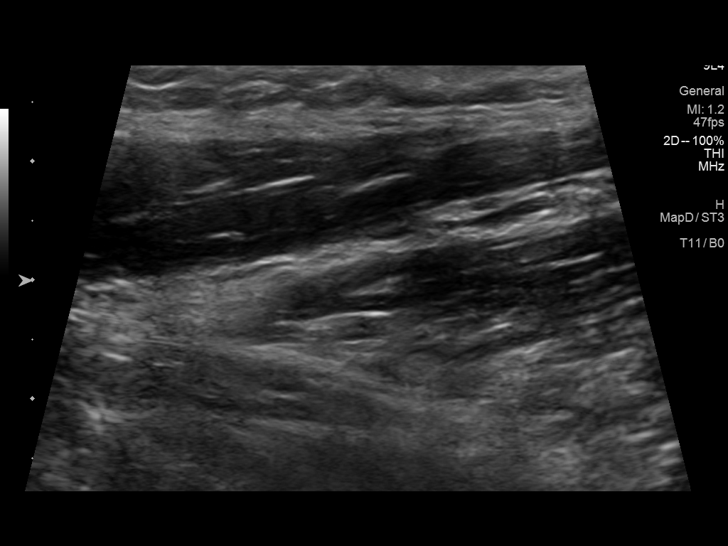
[im 18/34]
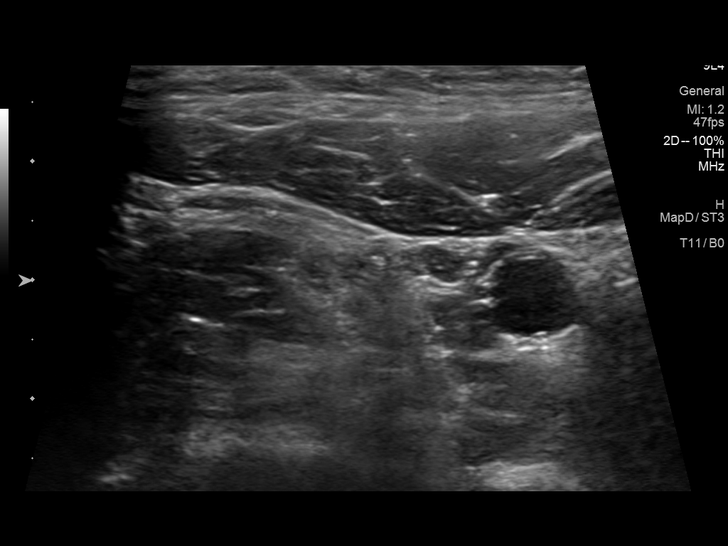
[im 21/34]
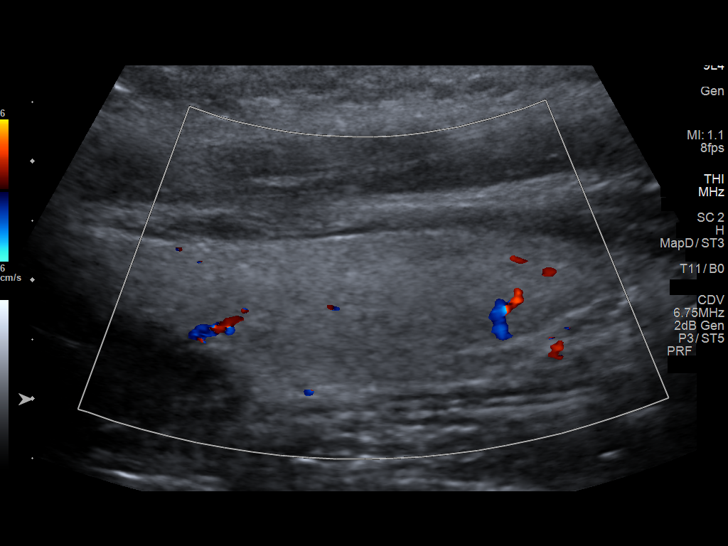
[im 23/34]
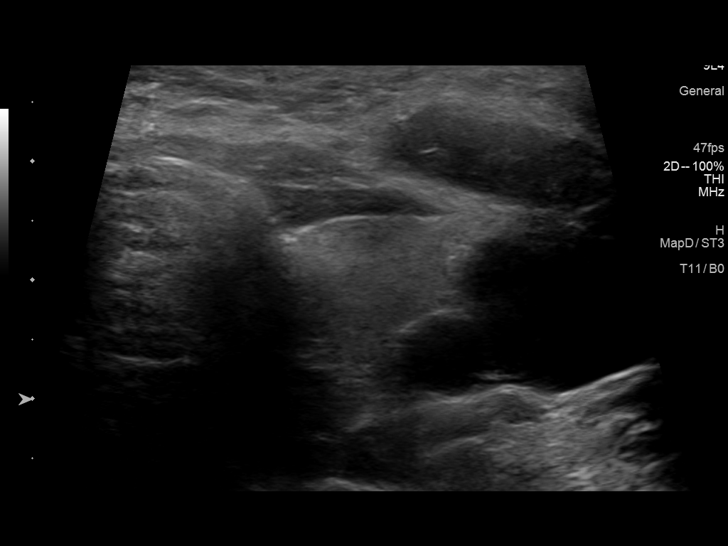
[im 25/34]
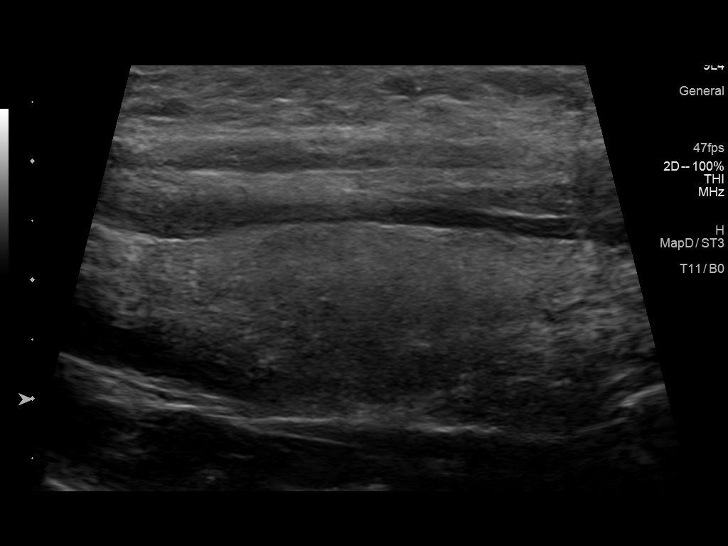
[im 28/34]
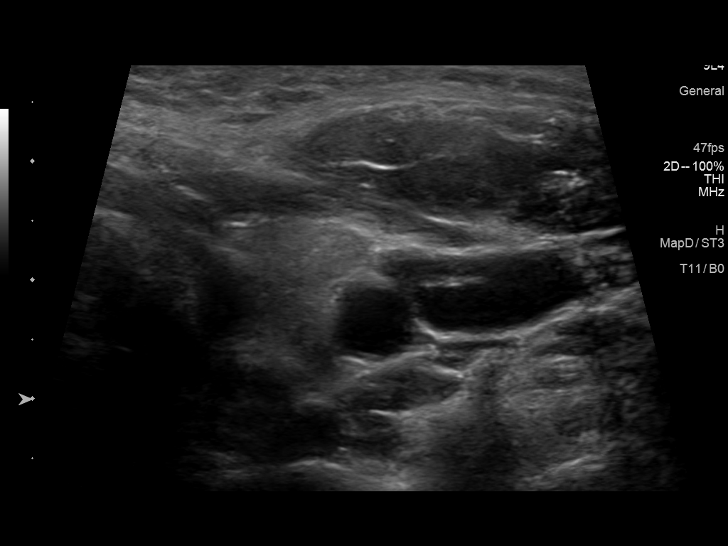
[im 31/34]
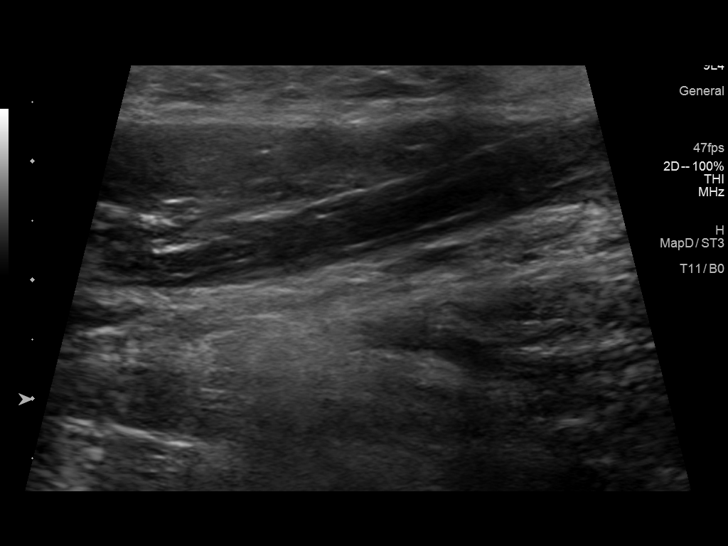
[im 34/34]
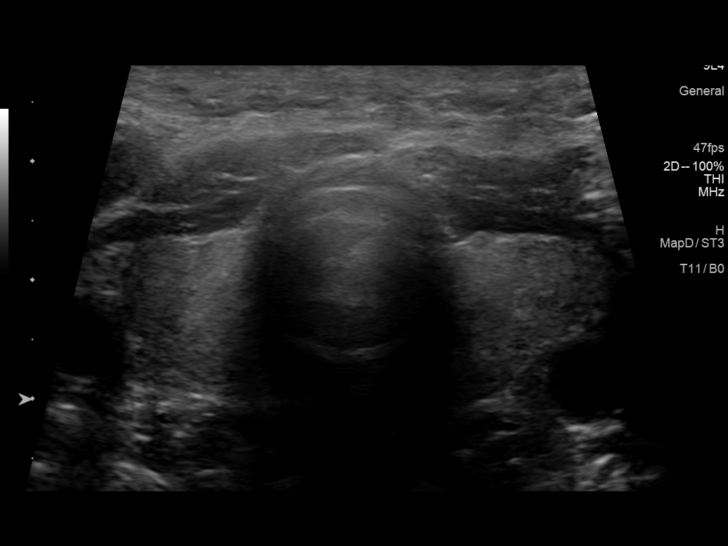

[14 of 25 positions shown; findings below may reference images not displayed]

FINDINGS: Parenchymal Echotexture: Mildly heterogenous

Isthmus: 2 mm

Right lobe: 4.3 x 1.8 x 1.6 cm

Left lobe: 4.2 x 1.4 x 1.5 cm

_________________________________________________________

Estimated total number of nodules >/= 1 cm: 0

Number of spongiform nodules >/=  2 cm not described below (TR1): 0

Number of mixed cystic and solid nodules >/= 1.5 cm not described
below (TR2): 0

_________________________________________________________

No discrete nodules are seen within the thyroid gland.
IMPRESSION: Normal thyroid ultrasound for age

The above is in keeping with the ACR TI-RADS recommendations - [HOSPITAL] 0944;[DATE].

## 2020-11-23 ENCOUNTER — Other Ambulatory Visit: Payer: Self-pay

## 2020-11-23 ENCOUNTER — Encounter: Payer: Self-pay | Admitting: Medical

## 2020-11-23 ENCOUNTER — Ambulatory Visit (INDEPENDENT_AMBULATORY_CARE_PROVIDER_SITE_OTHER): Payer: Managed Care, Other (non HMO) | Admitting: Medical

## 2020-11-23 VITALS — BP 120/82 | HR 91 | Ht 62.0 in | Wt 236.4 lb

## 2020-11-23 DIAGNOSIS — I10 Essential (primary) hypertension: Secondary | ICD-10-CM

## 2020-11-23 DIAGNOSIS — K219 Gastro-esophageal reflux disease without esophagitis: Secondary | ICD-10-CM | POA: Diagnosis not present

## 2020-11-23 DIAGNOSIS — R140 Abdominal distension (gaseous): Secondary | ICD-10-CM | POA: Insufficient documentation

## 2020-11-23 DIAGNOSIS — Z6841 Body Mass Index (BMI) 40.0 and over, adult: Secondary | ICD-10-CM | POA: Diagnosis not present

## 2020-11-23 DIAGNOSIS — R109 Unspecified abdominal pain: Secondary | ICD-10-CM | POA: Insufficient documentation

## 2020-11-23 DIAGNOSIS — Z13220 Encounter for screening for lipoid disorders: Secondary | ICD-10-CM | POA: Insufficient documentation

## 2020-11-23 MED ORDER — HYDROCHLOROTHIAZIDE 12.5 MG PO TABS: 12.5000 mg | ORAL_TABLET | Freq: Every day | ORAL | 1 refills | Status: DC

## 2020-11-23 MED ORDER — FAMOTIDINE 20 MG PO TABS: 20.0000 mg | ORAL_TABLET | Freq: Every day | ORAL | 0 refills | Status: DC

## 2020-11-23 MED ORDER — AMLODIPINE BESYLATE 5 MG PO TABS: 5.0000 mg | ORAL_TABLET | Freq: Every day | ORAL | 1 refills | Status: DC

## 2020-11-23 NOTE — Progress Notes (Signed)
Subjective:  Allison Wagner is a 35 y.o. female who presents for Chief Complaint  Patient presents with  . Medication Refill     HTN - needs refill on BP medication.  On her last pill.  Blood pressures have been looking fine at home.  No issues with medication.  GERD - wants something to prevent this.  Has had long-term issues for over a decade.  Had an endoscopy probably 12 years ago.  She tries to avoid GERD triggers.  Currently using over-the-counter Nexium with some relief.  She is worried that she may have some food allergies.  She gets bloating often, belly discomfort.  Gas.  No blood in the stool.  She is not sure which specific foods she may be sensitive to.  In the last month has lost some weight.  Has recently cut out carbs, exercising more.    No other aggravating or relieving factors.     No other c/o.  Past Medical History:  Diagnosis Date  . Asthma   . Dysphagia 2010   hospitalization, diagnosed with GERD and enlarged tonsils  . GERD (gastroesophageal reflux disease)   . Herpes genitalia    type 1  . Hx of blood clots    vaginal with menstruation  . Polycystic ovarian syndrome 1995  . Routine gynecological examination    Dr. Pennie Wagner  . Seasonal allergic rhinitis   . Trichomonas   . Yeast infection     The following portions of the patient's history were reviewed and updated as appropriate: allergies, current medications, past family history, past medical history, past social history, past surgical history and problem list.  ROS Otherwise as in subjective above    Objective: BP 120/82   Pulse 91   Ht 5\' 2"  (1.575 m)   Wt 236 lb 6.4 oz (107.2 kg)   SpO2 98%   BMI 43.24 kg/m   General appearance: alert, no distress, well developed, well nourished Neck: supple, no lymphadenopathy, no thyromegaly, no masses Heart: RRR, normal S1, S2, no murmurs Lungs: CTA bilaterally, no wheezes, rhonchi, or rales Abdomen: +bs, soft, non tender, non distended, no  masses, no hepatomegaly, no splenomegaly Pulses: 2+ radial pulses, 2+ pedal pulses, normal cap refill Ext: no edema   Assessment: Encounter Diagnoses  Name Primary?  . Essential hypertension Yes  . BMI 40.0-44.9, adult (HCC)   . Gastroesophageal reflux disease, unspecified whether esophagitis present   . Bloating   . Screening for lipid disorders   . Abdominal discomfort      Plan: Hypertension-doing well on current medication.  Refilled today.  Labs today.  GERD- begin famotidine, avoid GERD triggers as discussed  Bloating, abdominal discomfort and gas- advised food diary, avoid foods that seem to aggravate things, avoid gas causing foods such as onions and pinto beans and fried foods.  We discussed possibly referral to GI, possible food allergy testing.  She will consider and let me know how she wants to move forward.  Lipid screen labs today  Continue efforts to lose weight through healthy diet and exercise.  Congratulated her on her recent weight loss efforts  Advise she return soon for full physical and preventive care visit as she is past due for this  [name] was seen today for medication refill.  Diagnoses and all orders for this visit:  Essential hypertension -     Comprehensive metabolic panel  BMI 40.0-44.9, adult (HCC)  Gastroesophageal reflux disease, unspecified whether esophagitis present  Bloating  Screening for lipid disorders -  Lipid panel  Abdominal discomfort  Other orders -     amLODipine (NORVASC) 5 MG tablet; Take 1 tablet (5 mg total) by mouth daily. -     hydrochlorothiazide (HYDRODIURIL) 12.5 MG tablet; Take 1 tablet (12.5 mg total) by mouth daily. -     famotidine (PEPCID) 20 MG tablet; Take 1 tablet (20 mg total) by mouth at bedtime.    Follow up: pending labs, soon for physical

## 2020-11-24 LAB — COMPREHENSIVE METABOLIC PANEL
ALT: 12 IU/L (ref 0–32)
AST: 15 IU/L (ref 0–40)
Albumin/Globulin Ratio: 1.7 (ref 1.2–2.2)
Albumin: 4.4 g/dL (ref 3.8–4.8)
Alkaline Phosphatase: 82 IU/L (ref 44–121)
BUN/Creatinine Ratio: 15 (ref 9–23)
BUN: 12 mg/dL (ref 6–20)
Bilirubin Total: 0.3 mg/dL (ref 0.0–1.2)
CO2: 20 mmol/L (ref 20–29)
Calcium: 9.1 mg/dL (ref 8.7–10.2)
Chloride: 102 mmol/L (ref 96–106)
Creatinine, Ser: 0.8 mg/dL (ref 0.57–1.00)
Globulin, Total: 2.6 g/dL (ref 1.5–4.5)
Glucose: 92 mg/dL (ref 65–99)
Potassium: 4.5 mmol/L (ref 3.5–5.2)
Sodium: 139 mmol/L (ref 134–144)
Total Protein: 7 g/dL (ref 6.0–8.5)
eGFR: 98 mL/min/{1.73_m2} (ref >59)

## 2020-11-24 LAB — LIPID PANEL
Chol/HDL Ratio: 2.1 ratio (ref 0.0–4.4)
Cholesterol, Total: 98 mg/dL — ABNORMAL LOW (ref 100–199)
HDL: 47 mg/dL (ref >39)
LDL Chol Calc (NIH): 35 mg/dL (ref 0–99)
Triglycerides: 79 mg/dL (ref 0–149)
VLDL Cholesterol Cal: 16 mg/dL (ref 5–40)

## 2020-12-15 ENCOUNTER — Ambulatory Visit: Payer: Managed Care, Other (non HMO) | Admitting: Medical

## 2021-01-17 ENCOUNTER — Encounter: Payer: Self-pay | Admitting: Medical

## 2021-03-08 ENCOUNTER — Encounter: Payer: Managed Care, Other (non HMO) | Admitting: Medical

## 2021-04-09 ENCOUNTER — Encounter: Payer: Self-pay | Admitting: Internal Medicine

## 2021-04-13 ENCOUNTER — Other Ambulatory Visit: Payer: Self-pay | Admitting: Obstetrics and Gynecology

## 2021-04-13 ENCOUNTER — Encounter: Payer: Self-pay | Admitting: Internal Medicine

## 2021-05-16 ENCOUNTER — Encounter: Payer: Managed Care, Other (non HMO) | Admitting: Medical

## 2021-06-20 ENCOUNTER — Other Ambulatory Visit: Payer: Self-pay | Admitting: Medical

## 2021-06-20 ENCOUNTER — Other Ambulatory Visit: Payer: Self-pay

## 2021-06-20 ENCOUNTER — Ambulatory Visit (INDEPENDENT_AMBULATORY_CARE_PROVIDER_SITE_OTHER): Payer: Managed Care, Other (non HMO) | Admitting: Medical

## 2021-06-20 VITALS — BP 110/64 | HR 64 | Ht 63.0 in | Wt 232.2 lb

## 2021-06-20 DIAGNOSIS — J4520 Mild intermittent asthma, uncomplicated: Secondary | ICD-10-CM

## 2021-06-20 DIAGNOSIS — R140 Abdominal distension (gaseous): Secondary | ICD-10-CM

## 2021-06-20 DIAGNOSIS — R1084 Generalized abdominal pain: Secondary | ICD-10-CM | POA: Insufficient documentation

## 2021-06-20 DIAGNOSIS — I10 Essential (primary) hypertension: Secondary | ICD-10-CM

## 2021-06-20 DIAGNOSIS — K219 Gastro-esophageal reflux disease without esophagitis: Secondary | ICD-10-CM

## 2021-06-20 DIAGNOSIS — Z91018 Allergy to other foods: Secondary | ICD-10-CM | POA: Insufficient documentation

## 2021-06-20 MED ORDER — VALACYCLOVIR HCL 500 MG PO TABS: ORAL_TABLET | ORAL | 3 refills | Status: DC

## 2021-06-20 MED ORDER — AMLODIPINE BESYLATE 5 MG PO TABS: 5.0000 mg | ORAL_TABLET | Freq: Every day | ORAL | 3 refills | Status: DC

## 2021-06-20 MED ORDER — ESOMEPRAZOLE MAGNESIUM 40 MG PO CPDR: 40.0000 mg | DELAYED_RELEASE_CAPSULE | Freq: Every day | ORAL | 1 refills | Status: DC

## 2021-06-20 MED ORDER — ALBUTEROL SULFATE HFA 108 (90 BASE) MCG/ACT IN AERS: INHALATION_SPRAY | RESPIRATORY_TRACT | 1 refills | Status: DC

## 2021-06-20 MED ORDER — HYDROCHLOROTHIAZIDE 12.5 MG PO TABS: 12.5000 mg | ORAL_TABLET | Freq: Every day | ORAL | 3 refills | Status: DC

## 2021-06-20 NOTE — Telephone Encounter (Signed)
Pt has an appt today. Will discuss then

## 2021-06-20 NOTE — Progress Notes (Signed)
Subjective:  Allison Wagner is a 35 y.o. female who presents for Chief Complaint  Patient presents with   med check    Med check. No concerns. Declines flu shot     HTN - needs refill on BP medication.  Blood pressures have been looking fine at home.  No issues with medication.  She notes having normal thyroid labs throughout gynecology in July.  No bleeding or bruising.    Worried about food allergies.  Wonders if allergic to chicken, bread, pasta, rice.  Last visit the famotidine didn't help with her symptoms.  Has had long-term GERD issues for over a decade.  Had an endoscopy probably 12 years ago.  She tries to avoid GERD triggers.  Currently using over-the-counter Nexium with some relief.  She is worried that she may have some food allergies.  She gets bloating often, belly discomfort.  Gas.  No blood in the stool.  She is not sure which specific foods she may be sensitive to.  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    Has another 1.5 years left in school.  At [facility] for physical therapy program.    In the last month has lost some weight.  Has recently cut out carbs, exercising more.    No other aggravating or relieving factors.     No other c/o.  Past Medical History:  Diagnosis Date   Asthma    Dysphagia 2010   hospitalization, diagnosed with GERD and enlarged tonsils   GERD (gastroesophageal reflux disease)    Herpes genitalia    type 1   Hx of blood clots    vaginal with menstruation   Polycystic ovarian syndrome 1995   Routine gynecological examination    Dr. Pennie Rushing   Seasonal allergic rhinitis    Trichomonas    Yeast infection     The following portions of the patient's history were reviewed and updated as appropriate: allergies, current medications, past family history, past medical history, past social history, past surgical history and problem list.  ROS Otherwise as in subjective  above    Objective: BP 110/64   Pulse 64   Ht 5\' 3"  (1.6 m)   Wt 232 lb 3.2 oz (105.3 kg)   BMI 41.13 kg/m   Wt Readings from Last 3 Encounters:  06/20/21 232 lb 3.2 oz (105.3 kg)  11/23/20 236 lb 6.4 oz (107.2 kg)  08/08/20 239 lb 9.6 oz (108.7 kg)    General appearance: alert, no distress, well developed, well nourished Neck: supple, no lymphadenopathy, no thyromegaly, no masses Heart: RRR, normal S1, S2, no murmurs Lungs: CTA bilaterally, no wheezes, rhonchi, or rales Abdomen: +bs, soft, non tender, non distended, no masses, no hepatomegaly, no splenomegaly Pulses: 2+ radial pulses, 2+ pedal pulses, normal cap refill Ext: no edema   I reviewed CBC and TSH from 01/2021 from gynecology.   TSH normal, CBC normal but hemoglobin just barley in normal range.    Assessment: Encounter Diagnoses  Name Primary?   Generalized abdominal pain Yes   Bloating    Gastroesophageal reflux disease, unspecified whether esophagitis present    Food hypersensitivity    Mild intermittent asthma without complication    Essential hypertension       Plan: Hypertension-doing well on current medication.  Refilled today. .  GERD, abdominal pain, bloating, gas - begin famotidine, avoid GERD triggers as discussed  Bloating, abdominal discomfort and gas- return soon for  food allergy testing.  Avoid PPI, H2 blocker or allergy medication at least 4 days prior to testing.  Continue efforts to lose weight through healthy diet and exercise.    Allison Wagner was seen today for med check.  Diagnoses and all orders for this visit:  Generalized abdominal pain -     Food Allergy Profile; Future -     Celiac Ab tTG DGP TIgA; Future -     Allergen Profile, Food-Meat; Future -     Allergen Profile, Food-Grain; Future -     Allergen Profile, Vegetable II; Future -     Allergen Profile, Vegetable I; Future  Bloating -     Food Allergy Profile; Future -     Celiac Ab tTG DGP TIgA; Future -     Allergen  Profile, Food-Meat; Future -     Allergen Profile, Food-Grain; Future -     Allergen Profile, Vegetable II; Future -     Allergen Profile, Vegetable I; Future  Gastroesophageal reflux disease, unspecified whether esophagitis present -     Food Allergy Profile; Future -     Celiac Ab tTG DGP TIgA; Future -     Allergen Profile, Food-Meat; Future -     Allergen Profile, Food-Grain; Future -     Allergen Profile, Vegetable II; Future -     Allergen Profile, Vegetable I; Future  Food hypersensitivity -     Food Allergy Profile; Future -     Celiac Ab tTG DGP TIgA; Future -     Allergen Profile, Food-Meat; Future -     Allergen Profile, Food-Grain; Future -     Allergen Profile, Vegetable II; Future -     Allergen Profile, Vegetable I; Future  Mild intermittent asthma without complication  Essential hypertension  Other orders -     hydrochlorothiazide (HYDRODIURIL) 12.5 MG tablet; Take 1 tablet (12.5 mg total) by mouth daily. -     amLODipine (NORVASC) 5 MG tablet; Take 1 tablet (5 mg total) by mouth daily. -     valACYclovir (VALTREX) 500 MG tablet; Daily  forprevention, 4 tablets BID x 1 day for flare up -     albuterol (VENTOLIN HFA) 108 (90 Base) MCG/ACT inhaler; INHALE TWO PUFFS INTO LUNGS EVERY 6 HOURS AS NEEDED FOR WHEEZING -     esomeprazole (NEXIUM) 40 MG capsule; Take 1 capsule (40 mg total) by mouth daily.    Follow up: soon for allergy labs

## 2021-08-12 HISTORY — PX: HYSTEROSCOPY: SHX211

## 2021-10-23 ENCOUNTER — Encounter: Payer: Self-pay | Admitting: Internal Medicine

## 2022-04-17 ENCOUNTER — Encounter: Payer: Self-pay | Admitting: Internal Medicine

## 2022-05-21 ENCOUNTER — Encounter: Payer: Self-pay | Admitting: Internal Medicine

## 2022-06-04 ENCOUNTER — Other Ambulatory Visit: Payer: Self-pay | Admitting: Medical

## 2022-06-04 ENCOUNTER — Ambulatory Visit (INDEPENDENT_AMBULATORY_CARE_PROVIDER_SITE_OTHER): Payer: Managed Care, Other (non HMO) | Admitting: Medical

## 2022-06-04 VITALS — BP 104/64 | HR 71 | Temp 97.1°F | Resp 16 | Wt 237.6 lb

## 2022-06-04 DIAGNOSIS — R059 Cough, unspecified: Secondary | ICD-10-CM | POA: Diagnosis not present

## 2022-06-04 DIAGNOSIS — R062 Wheezing: Secondary | ICD-10-CM | POA: Diagnosis not present

## 2022-06-04 DIAGNOSIS — J4541 Moderate persistent asthma with (acute) exacerbation: Secondary | ICD-10-CM | POA: Diagnosis not present

## 2022-06-04 MED ORDER — LEVALBUTEROL TARTRATE 45 MCG/ACT IN AERO: 2.0000 | INHALATION_SPRAY | Freq: Four times a day (QID) | RESPIRATORY_TRACT | 2 refills | Status: DC | PRN

## 2022-06-04 MED ORDER — PREDNISONE 10 MG PO TABS: ORAL_TABLET | ORAL | 0 refills | Status: DC

## 2022-06-04 MED ORDER — BENZONATATE 200 MG PO CAPS: 200.0000 mg | ORAL_CAPSULE | Freq: Three times a day (TID) | ORAL | 0 refills | Status: DC | PRN

## 2022-06-04 MED ORDER — FLUTICASONE-SALMETEROL 115-21 MCG/ACT IN AERO: 2.0000 | INHALATION_SPRAY | Freq: Two times a day (BID) | RESPIRATORY_TRACT | 2 refills | Status: DC

## 2022-06-04 NOTE — Patient Instructions (Signed)
Other potential preventative inhalers: Symbicort Advair [place] Other   Other potential single medicaiton therapy options: Pulmicort Asmanex Qvar   Other rescue inhalers: Proair Albuterol Xopenex

## 2022-06-04 NOTE — Progress Notes (Signed)
Subjective:  Allison Wagner is a 36 y.o. female who presents for Chief Complaint  Patient presents with   sick    Been sick for a couple weeks. Has asthma, and has cough- that won't go away, some congestion     Here for asthma issues.  She had a cold respiratory infection a few weeks ago.  That flared up her asthma.  She has had ongoing cough and wheezing.  She is using a Xopenex but still having a lot of cough.  She gets flareups occasionally but not year-round.  She does not normally have significant fall or spring allergies but can get some asthma flareup.  She used a nebulizer as a kid but not as an adult.  No current fever, body aches or chills, no sore throat.  No nausea vomiting diarrhea.  No other trigger.  No itchy watery eyes.  No major congestion.  No other aggravating or relieving factors.    No other c/o.  Past Medical History:  Diagnosis Date   Asthma    Dysphagia 2010   hospitalization, diagnosed with GERD and enlarged tonsils   GERD (gastroesophageal reflux disease)    Herpes genitalia    type 1   Hx of blood clots    vaginal with menstruation   Polycystic ovarian syndrome 1995   Routine gynecological examination    Dr. Leo Grosser   Seasonal allergic rhinitis    Trichomonas    Yeast infection    Current Outpatient Medications on File Prior to Visit  Medication Sig Dispense Refill   amLODipine (NORVASC) 5 MG tablet Take 1 tablet (5 mg total) by mouth daily. 90 tablet 3   esomeprazole (NEXIUM) 40 MG capsule Take 1 capsule (40 mg total) by mouth daily. 90 capsule 1   hydrochlorothiazide (HYDRODIURIL) 12.5 MG tablet Take 1 tablet (12.5 mg total) by mouth daily. 90 tablet 3   hydroquinone 4 % cream Apply topically at bedtime.     Multiple Vitamins-Minerals (MULTIVITAMIN WITH MINERALS) tablet Take 1 tablet by mouth daily.     tretinoin (RETIN-A) 0.025 % cream Apply topically at bedtime.     valACYclovir (VALTREX) 500 MG tablet Daily  forprevention, 4 tablets BID x 1 day  for flare up 30 tablet 3   No current facility-administered medications on file prior to visit.    The following portions of the patient's history were reviewed and updated as appropriate: allergies, current medications, past family history, past medical history, past social history, past surgical history and problem list.  ROS Otherwise as in subjective above    Objective: BP 104/64   Pulse 71   Temp (!) 97.1 F (36.2 C)   Resp 16   Wt 237 lb 9.6 oz (107.8 kg)   SpO2 98%   BMI 42.09 kg/m   Wt Readings from Last 3 Encounters:  06/04/22 237 lb 9.6 oz (107.8 kg)  06/20/21 232 lb 3.2 oz (105.3 kg)  11/23/20 236 lb 6.4 oz (107.2 kg)   General appearance: alert, no distress, well developed, well nourished HEENT: normocephalic, sclerae anicteric, conjunctiva pink and moist, TMs pearly, nares patent, no discharge or erythema, pharynx normal Oral cavity: MMM, no lesions Neck: supple, no lymphadenopathy, no thyromegaly, no masses Heart: RRR, normal S1, S2, no murmurs Lungs: CTA bilaterally, no wheezes, rhonchi, or rales Pulses: 2+ radial pulses, 2+ pedal pulses, normal cap refill Ext: no edema   Assessment: Encounter Diagnoses  Name Primary?   Moderate persistent asthma with acute exacerbation Yes   Cough,  unspecified type    Wheezing      Plan: Begin Advair preventative inhaler for the next 1 to 2 months, consider over-the-counter antihistamine.  Continue Xopenex as needed.  Cough medicine as needed as below.  If not seeing improvement over the next few days consider adding prednisone sent to pharmacy.  If not much improved in the next week then call back or recheck sooner  Follow up: 2-3 wk

## 2022-06-05 ENCOUNTER — Telehealth: Payer: Self-pay | Admitting: Medical

## 2022-06-05 MED ORDER — BUDESONIDE-FORMOTEROL FUMARATE 160-4.5 MCG/ACT IN AERO: 2.0000 | INHALATION_SPRAY | Freq: Two times a day (BID) | RESPIRATORY_TRACT | 0 refills | Status: DC

## 2022-06-05 MED ORDER — LEVALBUTEROL TARTRATE 45 MCG/ACT IN AERO: 2.0000 | INHALATION_SPRAY | Freq: Four times a day (QID) | RESPIRATORY_TRACT | 2 refills | Status: DC | PRN

## 2022-06-05 NOTE — Telephone Encounter (Signed)
Pt called and stated pharmacy did not have her inhalers available and she needs to switch her xopenex hfa inhaler and symbicort inhaler to [facility] [address], [place], [place] [address]

## 2022-06-05 NOTE — Telephone Encounter (Signed)
done

## 2022-08-07 ENCOUNTER — Other Ambulatory Visit: Payer: Self-pay | Admitting: Medical

## 2022-09-06 ENCOUNTER — Encounter: Payer: Self-pay | Admitting: Medical

## 2022-09-06 ENCOUNTER — Ambulatory Visit (INDEPENDENT_AMBULATORY_CARE_PROVIDER_SITE_OTHER): Payer: Managed Care, Other (non HMO) | Admitting: Medical

## 2022-09-06 VITALS — BP 110/64 | HR 102 | Ht 62.75 in | Wt 241.2 lb

## 2022-09-06 DIAGNOSIS — Z0000 Encounter for general adult medical examination without abnormal findings: Secondary | ICD-10-CM

## 2022-09-06 DIAGNOSIS — E569 Vitamin deficiency, unspecified: Secondary | ICD-10-CM

## 2022-09-06 DIAGNOSIS — R5383 Other fatigue: Secondary | ICD-10-CM | POA: Diagnosis not present

## 2022-09-06 DIAGNOSIS — Z1329 Encounter for screening for other suspected endocrine disorder: Secondary | ICD-10-CM

## 2022-09-06 DIAGNOSIS — Z23 Encounter for immunization: Secondary | ICD-10-CM | POA: Diagnosis not present

## 2022-09-06 DIAGNOSIS — E282 Polycystic ovarian syndrome: Secondary | ICD-10-CM

## 2022-09-06 DIAGNOSIS — Z131 Encounter for screening for diabetes mellitus: Secondary | ICD-10-CM

## 2022-09-06 LAB — LIPID PANEL

## 2022-09-06 NOTE — Progress Notes (Signed)
Subjective:   HPI  Allison Wagner is a 37 y.o. female who presents for Chief Complaint  Patient presents with   fasting cpe    Fasting cpe, has PCOS- having some fatigue- would like to see where her vitamins and hormone levels are    Patient Care Team: Magdaline Zollars, Leward Quan as PCP - General (Family Medicine) Sees dentist Dr. [facility], Dr. Ardelle Balls, [facility]   Concerns: Has been working lifestyle changes, healthier diet, has a picture on her phone today showing fruits, vegetables and health items in her refrigerator  Taking some other supplements - Ashwaganda, multivitamin,  Vit D3, 236mcg/10k IU daily, berberine 1200mg  sometimes, magnesium  glycnate 200mg  nightly. Taking Myo Inositol 2000mg  QHS.  Having fatigue.  Wants several labs checked.  No hx/o apnea.  Has done sleep study before.  Dreams often.   Diagnosed with PCOS age 38yo.   Sees gyn.  Has monthly cycle.  Has some hirsutism she deals with, weight gain.   Past Medical History:  Diagnosis Date   Asthma    Dysphagia 08/12/2008   hospitalization, diagnosed with GERD and enlarged tonsils   GERD (gastroesophageal reflux disease)    Herpes genitalia    type 1   Hx of blood clots    vaginal with menstruation   Hypertension    Polycystic ovarian syndrome 08/12/1993   Routine gynecological examination    Dr. Leo Grosser   Seasonal allergic rhinitis    Trichomonas    Yeast infection     Family History  Problem Relation Age of Onset   Hypertension Mother    Migraines Mother    Fibroids Mother    Sleep apnea Mother    Migraines Brother    Hypertension Brother    Sleep apnea Brother    Cancer Maternal Aunt        lung   Heart disease Maternal Grandfather        MI   Hypertension Father      Current Outpatient Medications:    amLODipine (NORVASC) 5 MG tablet, TAKE 1 TABLET (5 MG TOTAL) BY MOUTH DAILY., Disp: 90 tablet, Rfl: 0   esomeprazole (NEXIUM) 40 MG capsule, TAKE 1 CAPSULE  (40 MG TOTAL) BY MOUTH DAILY., Disp: 90 capsule, Rfl: 0   hydrochlorothiazide (HYDRODIURIL) 12.5 MG tablet, TAKE 1 TABLET BY MOUTH EVERY DAY, Disp: 90 tablet, Rfl: 0   levalbuterol (XOPENEX HFA) 45 MCG/ACT inhaler, Inhale 2 puffs into the lungs every 6 (six) hours as needed for wheezing., Disp: 15 g, Rfl: 2   Multiple Vitamins-Minerals (MULTIVITAMIN WITH MINERALS) tablet, Take 1 tablet by mouth daily., Disp: , Rfl:    tretinoin (RETIN-A) 0.025 % cream, Apply topically at bedtime., Disp: , Rfl:    valACYclovir (VALTREX) 500 MG tablet, Daily  forprevention, 4 tablets BID x 1 day for flare up, Disp: 30 tablet, Rfl: 3   hydroquinone 4 % cream, Apply topically at bedtime. (Patient not taking: Reported on 09/06/2022), Disp: , Rfl:   Allergies  Allergen Reactions   Codeine Other (See Comments)    unsure   Haemophilus Influenzae Vaccines     Got sick with vaccine   Hydrocodone Other (See Comments)    h/a   Labetalol     Somnolence and edema       Reviewed their medical, surgical, family, social, medication, and allergy history and updated chart as appropriate.  Review of Systems  Constitutional:  Positive for malaise/fatigue. Negative for chills, fever and weight loss.  HENT:  Negative for congestion, ear pain, hearing loss, sore throat and tinnitus.   Eyes:  Negative for blurred vision, pain and redness.  Respiratory:  Negative for cough, hemoptysis and shortness of breath.   Cardiovascular:  Negative for chest pain, palpitations, orthopnea, claudication and leg swelling.  Gastrointestinal:  Negative for abdominal pain, blood in stool, constipation, diarrhea, nausea and vomiting.  Genitourinary:  Negative for dysuria, flank pain, frequency, hematuria and urgency.  Musculoskeletal:  Negative for falls, joint pain and myalgias.  Skin:  Negative for itching and rash.  Neurological:  Negative for dizziness, tingling, speech change, weakness and headaches.  Endo/Heme/Allergies:  Negative for  polydipsia. Does not bruise/bleed easily.  Psychiatric/Behavioral:  Negative for depression and memory loss. The patient is not nervous/anxious and does not have insomnia.         09/06/2022    1:54 PM 06/20/2021   11:04 AM 11/23/2020    8:39 AM 01/21/2019    8:50 AM 10/21/2018    8:25 AM  Depression screen PHQ 2/9  Decreased Interest 0 0 0 0 0  Down, Depressed, Hopeless 0 0 0 0 0  PHQ - 2 Score 0 0 0 0 0       Objective:  BP 110/64   Pulse (!) 102   Ht 5' 2.75" (1.594 m)   Wt 241 lb 3.2 oz (109.4 kg)   LMP 08/27/2022   BMI 43.07 kg/m   General appearance: alert, no distress, WD/WN, African American female Skin: right volar forearm just proximal to wrist with tattoo HEENT: normocephalic, conjunctiva/corneas normal, sclerae anicteric, PERRLA, EOMi, nares patent, no discharge or erythema, pharynx normal Oral cavity: MMM, tongue normal, teeth normal Neck: supple, no lymphadenopathy, no thyromegaly, no masses, normal ROM, no bruits Chest: non tender, normal shape and expansion Heart: RRR, normal S1, S2, no murmurs Lungs: CTA bilaterally, no wheezes, rhonchi, or rales Abdomen: +bs, soft, non tender, non distended, no masses, no hepatomegaly, no splenomegaly, no bruits Back: non tender, normal ROM, no scoliosis Musculoskeletal: upper extremities non tender, no obvious deformity, normal ROM throughout, lower extremities non tender, no obvious deformity, normal ROM throughout Extremities: no edema, no cyanosis, no clubbing Pulses: 2+ symmetric, upper and lower extremities, normal cap refill Neurological: alert, oriented x 3, CN2-12 intact, strength normal upper extremities and lower extremities, sensation normal throughout, DTRs 2+ throughout, no cerebellar signs, gait normal Psychiatric: normal affect, behavior normal, pleasant  Breast/gyn/rectal - deferred to gynecology    Assessment and Plan :   Encounter Diagnoses  Name Primary?   Encounter for health maintenance examination  in adult Yes   Need for Td vaccine    Other fatigue    Vitamin deficiency    PCOS (polycystic ovarian syndrome)    Screening for thyroid disorder    Screening for diabetes mellitus      This visit was a preventative care visit, also known as wellness visit or routine physical.   Topics typically include healthy lifestyle, diet, exercise, preventative care, vaccinations, sick and well care, proper use of emergency dept and after hours care, as well as other concerns.     Recommendations: Continue to return yearly for your annual wellness and preventative care visits.  This gives [place] a chance to discuss healthy lifestyle, exercise, vaccinations, review your chart record, and perform screenings where appropriate.  I recommend you see your eye doctor yearly for routine vision care.  I recommend you see your dentist yearly for routine dental care including hygiene visits twice yearly.  Vaccination recommendations were reviewed Immunization History  Administered Date(s) Administered   DTaP 10/30/1987, 07/08/1988, 10/14/1988, 07/20/1990, 08/18/1991   HIB (PRP-OMP) 10/21/1988   HPV Quadrivalent 08/15/2005, 03/13/2006   IPV 10/30/1987, 07/08/1988, 07/20/1990, 08/18/1991   Influenza,inj,Quad PF,6+ Mos 04/30/2013   MMR 10/14/1988, 08/18/1991   Meningococcal Conjugate 12/11/2005   Tdap 02/10/2013    Counseled on the Td (tetanus, diptheria) vaccine.  Vaccine information sheet given. Td vaccine given after consent obtained.   Screening for cancer: Colon cancer screening: Age 59  Breast cancer screening: You should perform a self breast exam monthly.   We reviewed recommendations for regular mammograms and breast cancer screening.  Cervical cancer screening: We reviewed recommendations for pap smear screening.   Skin cancer screening: Check your skin regularly for new changes, growing lesions, or other lesions of concern Come in for evaluation if you have skin lesions of  concern.  Lung cancer screening: If you have a greater than 20 pack year history of tobacco use, then you may qualify for lung cancer screening with a chest CT scan.   Please call your insurance company to inquire about coverage for this test.  We currently don't have screenings for other cancers besides breast, cervical, colon, and lung cancers.  If you have a strong family history of cancer or have other cancer screening concerns, please let me know.    Bone health: Get at least 150 minutes of aerobic exercise weekly Get weight bearing exercise at least once weekly Bone density test:  A bone density test is an imaging test that uses a type of X-ray to measure the amount of calcium and other minerals in your bones. The test may be used to diagnose or screen you for a condition that causes weak or thin bones (osteoporosis), predict your risk for a broken bone (fracture), or determine how well your osteoporosis treatment is working. The bone density test is recommended for females 65 and older, or females or males <65 if certain risk factors such as thyroid disease, long term use of steroids such as for asthma or rheumatological issues, vitamin D deficiency, estrogen deficiency, family history of osteoporosis, self or family history of fragility fracture in first degree relative.    Heart health: Get at least 150 minutes of aerobic exercise weekly Limit alcohol It is important to maintain a healthy blood pressure and healthy cholesterol numbers  Heart disease screening: Screening for heart disease includes screening for blood pressure, fasting lipids, glucose/diabetes screening, BMI height to weight ratio, reviewed of smoking status, physical activity, and diet.    Goals include blood pressure 120/80 or less, maintaining a healthy lipid/cholesterol profile, preventing diabetes or keeping diabetes numbers under good control, not smoking or using tobacco products, exercising most days per week  or at least 150 minutes per week of exercise, and eating healthy variety of fruits and vegetables, healthy oils, and avoiding unhealthy food choices like fried food, fast food, high sugar and high cholesterol foods.    Other tests may possibly include EKG test, CT coronary calcium score, echocardiogram, exercise treadmill stress test.    Medical care options: I recommend you continue to seek care here first for routine care.  We try really hard to have available appointments Monday through Friday daytime hours for sick visits, acute visits, and physicals.  Urgent care should be used for after hours and weekends for significant issues that cannot wait till the next day.  The emergency department should be used for significant potentially life-threatening emergencies.  The emergency department is expensive, can often have long wait times for less significant concerns, so try to utilize primary care, urgent care, or telemedicine when possible to avoid unnecessary trips to the emergency department.  Virtual visits and telemedicine have been introduced since the pandemic started in 2020, and can be convenient ways to receive medical care.  We offer virtual appointments as well to assist you in a variety of options to seek medical care.   Separate significant issues discussed: PCOS - sees gyn  Fatigue - labs today to further eval  Hypertension - continue current medication  Asthma - no recent concerns, continue inhaler prn, avoid trigggers  Leyton was seen today for fasting cpe.  Diagnoses and all orders for this visit:  Encounter for health maintenance examination in adult -     Hepatitis C antibody -     Comprehensive metabolic panel -     CBC with Differential/Platelet -     TSH -     Lipid panel -     Vitamin B12 -     VITAMIN D 25 Hydroxy (Vit-D Deficiency, Fractures) -     Cortisol -     Hemoglobin A1c -     Testosterone -     Estrogens, Total  Need for Td vaccine  Other  fatigue -     TSH -     Vitamin B12 -     VITAMIN D 25 Hydroxy (Vit-D Deficiency, Fractures) -     Cortisol  Vitamin deficiency -     Vitamin B12 -     VITAMIN D 25 Hydroxy (Vit-D Deficiency, Fractures)  PCOS (polycystic ovarian syndrome) -     Testosterone -     Estrogens, Total  Screening for thyroid disorder -     TSH  Screening for diabetes mellitus -     Hemoglobin A1c    Follow-up pending labs, yearly for physical

## 2022-09-06 NOTE — Addendum Note (Signed)
Addended by: Minette Headland A on: 09/06/2022 03:15 PM   Modules accepted: Orders

## 2022-09-09 ENCOUNTER — Other Ambulatory Visit: Payer: Self-pay | Admitting: Medical

## 2022-09-09 MED ORDER — VALACYCLOVIR HCL 500 MG PO TABS: ORAL_TABLET | ORAL | 3 refills | Status: DC

## 2022-09-09 MED ORDER — AMLODIPINE BESYLATE 5 MG PO TABS: 5.0000 mg | ORAL_TABLET | Freq: Every day | ORAL | 2 refills | Status: DC

## 2022-09-09 MED ORDER — HYDROCHLOROTHIAZIDE 12.5 MG PO TABS: 12.5000 mg | ORAL_TABLET | Freq: Every day | ORAL | 2 refills | Status: DC

## 2022-09-09 MED ORDER — ESOMEPRAZOLE MAGNESIUM 40 MG PO CPDR: 40.0000 mg | DELAYED_RELEASE_CAPSULE | Freq: Every day | ORAL | 0 refills | Status: DC

## 2022-09-09 MED ORDER — LEVALBUTEROL TARTRATE 45 MCG/ACT IN AERO: 2.0000 | INHALATION_SPRAY | Freq: Four times a day (QID) | RESPIRATORY_TRACT | 2 refills | Status: DC | PRN

## 2022-09-09 NOTE — Progress Notes (Signed)
Results sent through MyChart

## 2022-09-11 LAB — LIPID PANEL
Chol/HDL Ratio: 2.1 ratio (ref 0.0–4.4)
Cholesterol, Total: 117 mg/dL (ref 100–199)
HDL: 55 mg/dL (ref >39)
LDL Chol Calc (NIH): 42 mg/dL (ref 0–99)
Triglycerides: 109 mg/dL (ref 0–149)
VLDL Cholesterol Cal: 20 mg/dL (ref 5–40)

## 2022-09-11 LAB — ESTROGENS, TOTAL: Estrogen: 185 pg/mL

## 2022-09-11 LAB — CBC WITH DIFFERENTIAL/PLATELET
Basophils Absolute: 0 10*3/uL (ref 0.0–0.2)
Basos: 0 %
EOS (ABSOLUTE): 0.1 10*3/uL (ref 0.0–0.4)
Eos: 1 %
Hematocrit: 36.9 % (ref 34.0–46.6)
Hemoglobin: 11.8 g/dL (ref 11.1–15.9)
Immature Grans (Abs): 0 10*3/uL (ref 0.0–0.1)
Immature Granulocytes: 0 %
Lymphocytes Absolute: 3 10*3/uL (ref 0.7–3.1)
Lymphs: 40 %
MCH: 25.6 pg — ABNORMAL LOW (ref 26.6–33.0)
MCHC: 32 g/dL (ref 31.5–35.7)
MCV: 80 fL (ref 79–97)
Monocytes Absolute: 0.4 10*3/uL (ref 0.1–0.9)
Monocytes: 5 %
Neutrophils Absolute: 4 10*3/uL (ref 1.4–7.0)
Neutrophils: 54 %
Platelets: 480 10*3/uL — ABNORMAL HIGH (ref 150–450)
RBC: 4.61 x10E6/uL (ref 3.77–5.28)
RDW: 13.2 % (ref 11.7–15.4)
WBC: 7.5 10*3/uL (ref 3.4–10.8)

## 2022-09-11 LAB — COMPREHENSIVE METABOLIC PANEL
ALT: 16 IU/L (ref 0–32)
AST: 17 IU/L (ref 0–40)
Albumin/Globulin Ratio: 1.6 (ref 1.2–2.2)
Albumin: 4.8 g/dL (ref 3.9–4.9)
Alkaline Phosphatase: 99 IU/L (ref 44–121)
BUN/Creatinine Ratio: 11 (ref 9–23)
BUN: 10 mg/dL (ref 6–20)
Bilirubin Total: 0.3 mg/dL (ref 0.0–1.2)
CO2: 22 mmol/L (ref 20–29)
Calcium: 9.9 mg/dL (ref 8.7–10.2)
Chloride: 100 mmol/L (ref 96–106)
Creatinine, Ser: 0.88 mg/dL (ref 0.57–1.00)
Globulin, Total: 3 g/dL (ref 1.5–4.5)
Glucose: 89 mg/dL (ref 70–99)
Potassium: 4.1 mmol/L (ref 3.5–5.2)
Sodium: 139 mmol/L (ref 134–144)
Total Protein: 7.8 g/dL (ref 6.0–8.5)
eGFR: 87 mL/min/{1.73_m2} (ref >59)

## 2022-09-11 LAB — HEMOGLOBIN A1C
Est. average glucose Bld gHb Est-mCnc: 114 mg/dL
Hgb A1c MFr Bld: 5.6 % (ref 4.8–5.6)

## 2022-09-11 LAB — CORTISOL: Cortisol: 5.6 ug/dL — ABNORMAL LOW (ref 6.2–19.4)

## 2022-09-11 LAB — VITAMIN B12: Vitamin B-12: 1139 pg/mL (ref 232–1245)

## 2022-09-11 LAB — TSH: TSH: 2.5 u[IU]/mL (ref 0.450–4.500)

## 2022-09-11 LAB — TESTOSTERONE: Testosterone: 18 ng/dL (ref 8–60)

## 2022-09-11 LAB — VITAMIN D 25 HYDROXY (VIT D DEFICIENCY, FRACTURES): Vit D, 25-Hydroxy: 54.6 ng/mL (ref 30.0–100.0)

## 2022-09-11 LAB — HEPATITIS C ANTIBODY: Hep C Virus Ab: NONREACTIVE

## 2022-09-11 NOTE — Progress Notes (Signed)
Results sent through MyChart

## 2022-09-16 ENCOUNTER — Other Ambulatory Visit: Payer: Self-pay | Admitting: Medical

## 2022-09-16 ENCOUNTER — Telehealth: Payer: Self-pay | Admitting: Medical

## 2022-09-16 ENCOUNTER — Telehealth: Payer: Self-pay | Admitting: Internal Medicine

## 2022-09-16 MED ORDER — WEGOVY 0.5 MG/0.5ML ~~LOC~~ SOAJ: 0.5000 mg | SUBCUTANEOUS | 0 refills | Status: DC

## 2022-09-16 MED ORDER — ZEPBOUND 5 MG/0.5ML ~~LOC~~ SOAJ: 5.0000 mg | SUBCUTANEOUS | 0 refills | Status: DC

## 2022-09-16 MED ORDER — WEGOVY 0.25 MG/0.5ML ~~LOC~~ SOAJ: 0.2500 mg | SUBCUTANEOUS | 0 refills | Status: DC

## 2022-09-16 MED ORDER — ZEPBOUND 2.5 MG/0.5ML ~~LOC~~ SOAJ: 2.5000 mg | SUBCUTANEOUS | 0 refills | Status: DC

## 2022-09-16 NOTE — Telephone Encounter (Signed)
Pt called insurance will not cover weight loss med but she wants to know if she can get something for PCOS. She can not take metformin as it large pills. She wants to know if their is a shot she can take or small pill. She does not have type 2 dm so not sure if other (weight loss meds) will be covered for PCOS.

## 2022-09-16 NOTE — Telephone Encounter (Signed)
Pt called & states Wegovy not covered, says checked on the portal & GLP1 covered are Ozempic & Mounjaro, advised probably for Type 2 Diabetes.  Advised can try P.A.  Audelia Acton also sent in Zepbound at the same time

## 2022-09-16 NOTE — Telephone Encounter (Signed)
Spoke to patient. See other telephone call

## 2022-09-18 NOTE — Telephone Encounter (Signed)
Pt was notified of results and will research qsymia and let us know

## 2022-09-18 NOTE — Telephone Encounter (Signed)
Pt called you back, you were busy I told her you would try and call again.

## 2022-09-18 NOTE — Telephone Encounter (Signed)
Pt states Wegovy & Zepbound neither are covered.  Gabriel Cirri gave her option of Qsymia per Audelia Acton & she will check into this.

## 2022-09-18 NOTE — Telephone Encounter (Signed)
Left message for pt to call me back 

## 2022-10-08 ENCOUNTER — Ambulatory Visit (INDEPENDENT_AMBULATORY_CARE_PROVIDER_SITE_OTHER): Payer: Managed Care, Other (non HMO) | Admitting: Medical

## 2022-10-08 ENCOUNTER — Encounter: Payer: Self-pay | Admitting: Medical

## 2022-10-08 VITALS — BP 124/84 | HR 76 | Ht 62.0 in | Wt 242.0 lb

## 2022-10-08 DIAGNOSIS — S8991XD Unspecified injury of right lower leg, subsequent encounter: Secondary | ICD-10-CM

## 2022-10-08 DIAGNOSIS — M25561 Pain in right knee: Secondary | ICD-10-CM

## 2022-10-08 DIAGNOSIS — T50Z95A Adverse effect of other vaccines and biological substances, initial encounter: Secondary | ICD-10-CM

## 2022-10-08 NOTE — Patient Instructions (Signed)
Recommendations: Right knee hematoma, contusion and pain I recommend Ibuprofen '200mg'$  over the counter, 3 tablets twice daily for a week After 1 week, change to Aspirin over the counter '325mg'$  daily for 2 weeks Use alternating ice water therapy and heat therapy, 15 minutes each daily for the next week Stretch the leg and knee daily The pain and tightness should gradually resolve over the next month If not much improved in 2-4 weeks, let me know and we can refer to sports medicine    Left arm knot, s/p vaccine This is not unusual It can take some weeks to resolve Be reassured this will gradually go away   Hematoma A hematoma is a collection of blood. A hematoma can happen: Under the skin. In an organ. In a body space. In a joint space. In other tissues. The blood can thicken (clot) to form a lump that you can see and feel. The lump is often hard and may become sore and tender. The lump can be very small or very big. Most hematomas get better in a few days to weeks. However, some of these may be serious and need medical care. What are the causes? This condition is caused by: An injury. Blood that leaks under the skin. Problems from surgeries. Medical conditions that cause bleeding or bruising. What increases the risk? You are more likely to develop this condition if: You are an older adult. You use medicines that thin your blood. You use NSAIDs, such as ibuprofen, often for pain. You play contact sports. What are the signs or symptoms? Symptoms depend on where the hematoma is in your body. If the hematoma is under the skin, there is: A firm lump on the body. Pain and tenderness in the area. Bruising. The skin above the lump may be blue, dark blue, purple-red, or yellowish. If the hematoma is deep in the tissues or body spaces, there may be: Blood in the stomach. This may cause pain in the belly (abdomen), weakness, passing out (fainting), and shortness of breath. Blood in the  head. This may cause a headache, weakness, trouble speaking or understanding speech, or passing out. How is this treated? Treatment depends on the cause, size, and location of the hematoma. Treatment may include: Doing nothing. Many hematomas go away on their own without treatment. Surgery or close monitoring. This may be needed for large hematomas or hematomas that affect the body's organs. Medicines. These may be given if a medical condition caused the hematoma. Follow these instructions at home: Managing pain, stiffness, and swelling  If told, put ice on the injured area. To do this: Put ice in a plastic bag. Place a towel between your skin and the bag. Leave the ice on for 20 minutes, 2-3 times a day for the first two days. If your skin turns bright red, take off the ice right away to prevent skin damage. The risk of skin damage is higher if you cannot feel pain, heat, or cold. If told, put heat on the injured area. Do this as often as told by your doctor. Use the heat source that your doctor recommends, such as a moist heat pack or a heating pad. Place a towel between your skin and the heat source. Leave the heat on for 20-30 minutes. If your skin turns bright red, take off the heat right away to prevent burns. The risk of burns is higher if you cannot feel pain, heat, or cold. Raise the injured area above the level of your  heart while you are sitting or lying down. Wrap the affected area with an elastic bandage, if told by your doctor. Do not wrap the bandage too tight. If your hematoma is on a leg or foot and is painful, your doctor may give you crutches. Use them as told by your doctor. General instructions Take over-the-counter and prescription medicines only as told by your doctor. Keep all follow-up visits. Your doctor may want to see how your hematoma is healing with treatment. Contact a doctor if: You have a fever. The swelling or bruising gets worse. You start to get more  hematomas. Your pain gets worse. Your pain is not getting better with medicine. The skin over the hematoma breaks or starts to bleed. Get help right away if: Your hematoma is in your chest or belly and you: Pass out. Feel weak. Become short of breath. You have a hematoma on your scalp that is caused by a fall or injury, and you: Have a headache that gets worse. Have trouble speaking or understanding speech. Become less alert or you pass out. These symptoms may be an emergency. Get help right away. Call 911. Do not wait to see if the symptoms will go away. Do not drive yourself to the hospital This information is not intended to replace advice given to you by your health care provider. Make sure you discuss any questions you have with your health care provider. Document Revised: 01/21/2022 Document Reviewed: 01/21/2022 [employer] Patient Education  [place].

## 2022-10-08 NOTE — Progress Notes (Signed)
Subjective:  Allison Wagner is a 37 y.o. female who presents for Chief Complaint  Patient presents with   Knee Pain    Went to UC on 09/29/22. She ran into glass table on 09/15/22. They did Xray and told her to take advil and voltaren gel. Still having stiffness, popping and sharp pain.      Here for right knee pain.   She notes that she ran into her glass coffee table 09/15/22.   Was seen at urgent care 09/29/22, had xray.  Was advised Voltaren gel and oral Advil .  Still having stiffness, popping, and sharp pain.   Used some ice initially.  Feels a little numb in right anterior knee, but no other numbness or tingling in leg.  No hip or ankle pain. No other aggravating or relieving factors.    She notes having a vaccine here in January, had left arm swollen and warmth and a knot for a week or so but the knot hasn't resolved  No other c/o.  The following portions of the patient's history were reviewed and updated as appropriate: allergies, current medications, past family history, past medical history, past social history, past surgical history and problem list.  ROS Otherwise as in subjective above     Objective: BP 124/84   Pulse 76   Ht '5\' 2"'$  (1.575 m)   Wt 242 lb (109.8 kg)   LMP 09/28/2022   BMI 44.26 kg/m   General appearance: alert, no distress, well developed, well nourished Right knee with tendnerss over right lateral joint line, somewhat dark faint coloration of right anterior knee, but otherwise knee nontender with normal rom and no laxity or other deformity Left lateral arm with small 54m knot under skin subcutaneous but no warmth, fluctuance at the skin at location of prior vaccine Pulses: 2+ radial pulses, 2+ pedal pulses, normal cap refill Ext: no edema   Assessment: Encounter Diagnoses  Name Primary?   Acute pain of right knee Yes   Injury of right knee, subsequent encounter      Plan: We discussed findings, exam, mechanism of injury  I reviewed right knee  xray from 09/29/22 in care everywhere, normal.  Patient Instructions  Recommendations: Right knee hematoma, contusion and pain I recommend Ibuprofen '200mg'$  over the counter, 3 tablets twice daily for a week After 1 week, change to Aspirin over the counter '325mg'$  daily for 2 weeks Use alternating ice water therapy and heat therapy, 15 minutes each daily for the next week Stretch the leg and knee daily The pain and tightness should gradually resolve over the next month If not much improved in 2-4 weeks, let me know and we can refer to sports medicine    Left arm knot, s/p vaccine This is not unusual It can take some weeks to resolve Be reassured this will gradually go away   Hematoma A hematoma is a collection of blood. A hematoma can happen: Under the skin. In an organ. In a body space. In a joint space. In other tissues. The blood can thicken (clot) to form a lump that you can see and feel. The lump is often hard and may become sore and tender. The lump can be very small or very big. Most hematomas get better in a few days to weeks. However, some of these may be serious and need medical care. What are the causes? This condition is caused by: An injury. Blood that leaks under the skin. Problems from surgeries. Medical conditions that cause  bleeding or bruising. What increases the risk? You are more likely to develop this condition if: You are an older adult. You use medicines that thin your blood. You use NSAIDs, such as ibuprofen, often for pain. You play contact sports. What are the signs or symptoms? Symptoms depend on where the hematoma is in your body. If the hematoma is under the skin, there is: A firm lump on the body. Pain and tenderness in the area. Bruising. The skin above the lump may be blue, dark blue, purple-red, or yellowish. If the hematoma is deep in the tissues or body spaces, there may be: Blood in the stomach. This may cause pain in the belly (abdomen),  weakness, passing out (fainting), and shortness of breath. Blood in the head. This may cause a headache, weakness, trouble speaking or understanding speech, or passing out. How is this treated? Treatment depends on the cause, size, and location of the hematoma. Treatment may include: Doing nothing. Many hematomas go away on their own without treatment. Surgery or close monitoring. This may be needed for large hematomas or hematomas that affect the body's organs. Medicines. These may be given if a medical condition caused the hematoma. Follow these instructions at home: Managing pain, stiffness, and swelling  If told, put ice on the injured area. To do this: Put ice in a plastic bag. Place a towel between your skin and the bag. Leave the ice on for 20 minutes, 2-3 times a day for the first two days. If your skin turns bright red, take off the ice right away to prevent skin damage. The risk of skin damage is higher if you cannot feel pain, heat, or cold. If told, put heat on the injured area. Do this as often as told by your doctor. Use the heat source that your doctor recommends, such as a moist heat pack or a heating pad. Place a towel between your skin and the heat source. Leave the heat on for 20-30 minutes. If your skin turns bright red, take off the heat right away to prevent burns. The risk of burns is higher if you cannot feel pain, heat, or cold. Raise the injured area above the level of your heart while you are sitting or lying down. Wrap the affected area with an elastic bandage, if told by your doctor. Do not wrap the bandage too tight. If your hematoma is on a leg or foot and is painful, your doctor may give you crutches. Use them as told by your doctor. General instructions Take over-the-counter and prescription medicines only as told by your doctor. Keep all follow-up visits. Your doctor may want to see how your hematoma is healing with treatment. Contact a doctor if: You have  a fever. The swelling or bruising gets worse. You start to get more hematomas. Your pain gets worse. Your pain is not getting better with medicine. The skin over the hematoma breaks or starts to bleed. Get help right away if: Your hematoma is in your chest or belly and you: Pass out. Feel weak. Become short of breath. You have a hematoma on your scalp that is caused by a fall or injury, and you: Have a headache that gets worse. Have trouble speaking or understanding speech. Become less alert or you pass out. These symptoms may be an emergency. Get help right away. Call 911. Do not wait to see if the symptoms will go away. Do not drive yourself to the hospital This information is not intended to replace  advice given to you by your health care provider. Make sure you discuss any questions you have with your health care provider. Document Revised: 01/21/2022 Document Reviewed: 01/21/2022 Elsevier Patient Education  2023 [employer].      Rhaelyn was seen today for knee pain.  Diagnoses and all orders for this visit:  Acute pain of right knee  Injury of right knee, subsequent encounter    Follow up: prn

## 2023-06-14 ENCOUNTER — Other Ambulatory Visit: Payer: Self-pay

## 2023-06-14 ENCOUNTER — Ambulatory Visit: Payer: 59

## 2023-06-14 ENCOUNTER — Ambulatory Visit
Admission: RE | Admit: 2023-06-14 | Discharge: 2023-06-14 | Disposition: A | Discharge status: Home / Self Care | Payer: Managed Care, Other (non HMO) | Source: Ambulatory Visit | Attending: Family Medicine | Admitting: Family Medicine

## 2023-06-14 VITALS — BP 145/85 | HR 100 | Temp 98.4°F | Resp 18

## 2023-06-14 DIAGNOSIS — N898 Other specified noninflammatory disorders of vagina: Secondary | ICD-10-CM | POA: Diagnosis not present

## 2023-06-14 DIAGNOSIS — L292 Pruritus vulvae: Secondary | ICD-10-CM | POA: Insufficient documentation

## 2023-06-14 MED ORDER — FLUCONAZOLE 200 MG PO TABS: ORAL_TABLET | ORAL | 0 refills | Status: DC

## 2023-06-14 NOTE — ED Provider Notes (Signed)
[facility]    CSN: [id number] Arrival date & time: 06/14/23  1227      History   Chief Complaint Chief Complaint  Patient presents with   Vaginal Itching    Entered by patient    HPI Allison Wagner is a 37 y.o. female.   HPI 38 year old female presents with vaginal itching for 2 days.  Patient reports using OTC Monistat with little to no relief.  PMH significant for morbid obesity, HTN, and PCOS.  Past Medical History:  Diagnosis Date   Asthma    Dysphagia 08/12/2008   hospitalization, diagnosed with GERD and enlarged tonsils   GERD (gastroesophageal reflux disease)    Herpes genitalia    type 1   Hx of blood clots    vaginal with menstruation   Hypertension    Polycystic ovarian syndrome 08/12/1993   Routine gynecological examination    Dr. 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   Goiter 12/14/2019   Toenail deformity 12/14/2019   Cyst of skin 01/21/2019   Other specified anemias 10/25/2015   Genital herpes 10/25/2015   Asthma, mild intermittent 10/25/2015   Gastroesophageal reflux disease without esophagitis 10/25/2015   Essential hypertension 10/25/2015   Screening for diabetes mellitus 10/25/2015   Family history of hypertension 09/20/2015   Obesity 09/20/2015   Snoring 09/20/2015   Other fatigue 09/20/2015   Oligomenorrhea 05/18/2012   PCOS  (polycystic ovarian syndrome) 01/08/2012    Past Surgical History:  Procedure Laterality Date   HYSTEROSCOPY  2023   TONSILECTOMY, ADENOIDECTOMY, BILATERAL MYRINGOTOMY AND TUBES      OB History     Gravida  0   Para  0   Term      Preterm      AB      Living         SAB      IAB      Ectopic      Multiple      Live Births               Home Medications    Prior to Admission medications   Medication Sig Start Date End Date Taking? Authorizing Provider  fluconazole (DIFLUCAN) 200 MG tablet Take 1 tab p.o. now, may repeat 1 tab p.o. in 3 days if symptoms are not resolved. 06/14/23  Yes Trevor Iha, FNP  amLODipine (NORVASC) 5 MG tablet Take 1 tablet (5 mg total) by mouth daily. 09/09/22   Tysinger, Kermit Balo, PA-C  diclofenac Sodium (VOLTAREN) 1 % GEL Apply to affected area 09/29/22   [provider]  esomeprazole (NEXIUM) 40 MG capsule Take 1 capsule (40 mg total) by mouth daily. Patient not taking: Reported on 10/08/2022 09/09/22   Tysinger, Kermit Balo, PA-C  hydrochlorothiazide (HYDRODIURIL) 12.5 MG tablet Take 1 tablet (12.5 mg total) by mouth daily. 09/09/22   Tysinger, Kermit Balo, PA-C  hydroquinone 4 % cream Apply topically at bedtime. 03/15/21   [provider]  ibuprofen (ADVIL) 200 MG tablet Take 800 mg by mouth every 6 (six) hours as needed.    [provider]  levalbuterol Pauline Aus HFA) 45 MCG/ACT inhaler Inhale 2 puffs into the lungs every 6 (six) hours as needed for wheezing. Patient not taking: Reported on 10/08/2022 09/09/22   Tysinger, Kermit Balo, PA-C  Multiple Vitamins-Minerals (MULTIVITAMIN WITH MINERALS) tablet Take 1 tablet by mouth daily.    [provider]  tretinoin (RETIN-A) 0.025 % cream Apply topically at bedtime. 03/15/21   [provider]  valACYclovir (VALTREX) 500 MG tablet Daily  forprevention, 4 tablets BID x 1 day for flare up Patient not taking: Reported on 10/08/2022 09/09/22   Tysinger, Kermit Balo, PA-C     Family History Family History  Problem Relation Age of Onset   Hypertension Mother    Migraines Mother    Fibroids Mother    Sleep apnea Mother    Migraines Brother    Hypertension Brother    Sleep apnea Brother    Cancer Maternal Aunt        lung   Heart disease Maternal Grandfather        MI   Hypertension Father     Social History Social History   Tobacco Use   Smoking status: Never   Smokeless tobacco: Never  Substance Use Topics   Alcohol use: Yes    Comment: occasional    Drug use: No     Allergies   Codeine, Haemophilus influenzae vaccines, Hydrocodone, and Labetalol   Review of Systems Review of Systems   Physical Exam Triage Vital Signs ED Triage Vitals [06/14/23 1237]  Encounter Vitals Group     BP (!) 145/85     Systolic BP Percentile      Diastolic BP Percentile      Pulse Rate 100     Resp 18     Temp 98.4 F (36.9 C)     Temp Source Oral     SpO2 100 %     Weight      Height      Head Circumference      Peak Flow      Pain Score 0     Pain Loc      Pain Education      Exclude from Growth Chart    No data found.  Updated Vital Signs BP (!) 145/85 (BP Location: Right Arm)   Pulse 100   Temp 98.4 F (36.9 C) (Oral)   Resp 18   LMP 06/02/2023 (Approximate)   SpO2 100%   Physical Exam Vitals and nursing note reviewed.  Constitutional:      Appearance: Normal appearance. She is obese.  HENT:     Head: Normocephalic and atraumatic.     Mouth/Throat:     Mouth: Mucous membranes are moist.     Pharynx: Oropharynx is clear.  Eyes:     Extraocular Movements: Extraocular movements intact.     Conjunctiva/sclera: Conjunctivae normal.     Pupils: Pupils are equal, round, and reactive to light.  Cardiovascular:     Rate and Rhythm: Normal rate and regular rhythm.     Pulses: Normal pulses.     Heart sounds: Normal heart sounds.  Pulmonary:     Effort: Pulmonary effort is normal.     Breath sounds: Normal breath sounds. No  wheezing, rhonchi or rales.  Musculoskeletal:  General: Normal range of motion.     Cervical back: Normal range of motion and neck supple.  Skin:    General: Skin is warm and dry.  Neurological:     General: No focal deficit present.     Mental Status: She is alert and oriented to person, place, and time. Mental status is at baseline.  Psychiatric:        Mood and Affect: Mood normal.        Behavior: Behavior normal.      UC Treatments / Results  Labs (all labs ordered are listed, but only abnormal results are displayed) Labs Reviewed  CERVICOVAGINAL ANCILLARY ONLY    EKG   Radiology No results found.  Procedures Procedures (including critical care time)  Medications Ordered in UC Medications - No data to display  Initial Impression / Assessment and Plan / UC Course  I have reviewed the triage vital signs and the nursing notes.  Pertinent labs & imaging results that were available during my care of the patient were reviewed by me and considered in my medical decision making (see chart for details).     MDM: 1.  Vaginal itching-Rx'd Diflucan 250 mg tablet: Take 1 tablet p.o. now may repeat 1 tab p.o. in 3 days if symptoms are not resolved.  Aptima swab ordered. Advised patient take medication as directed with food to completion.  Encouraged increased daily water intake to 64 ounces per day while taking this medication.  Advised we will follow-up with Aptima swab results once received.  Advised if symptoms worsen and/or unresolved please follow-up with PCP or here for further evaluation.  Patient discharged home, hemodynamically stable. Final Clinical Impressions(s) / UC Diagnoses   Final diagnoses:  Vaginal itching     Discharge Instructions      Advised patient take medication as directed with food to completion.  Encouraged increased daily water intake to 64 ounces per day while taking this medication.  Advised we will follow-up with Aptima swab results  once received.  Advised if symptoms worsen and/or unresolved please follow-up with PCP or here for further evaluation.     ED Prescriptions     Medication Sig Dispense Auth. Provider   fluconazole (DIFLUCAN) 200 MG tablet Take 1 tab p.o. now, may repeat 1 tab p.o. in 3 days if symptoms are not resolved. 7 tablet Trevor Iha, FNP      PDMP not reviewed this encounter.   Trevor Iha, FNP 06/14/23 1313

## 2023-06-14 NOTE — Discharge Instructions (Addendum)
Advised patient take medication as directed with food to completion.  Encouraged increased daily water intake to 64 ounces per day while taking this medication.  Advised we will follow-up with Aptima swab results once received.  Advised if symptoms worsen and/or unresolved please follow-up with PCP or here for further evaluation.

## 2023-06-14 NOTE — ED Triage Notes (Signed)
Pt arrive c/o vaginal itchiness since Thursday. Used Monistat over the counter with some relief, but still irritated.

## 2023-06-16 LAB — CERVICOVAGINAL ANCILLARY ONLY
Bacterial Vaginitis (gardnerella): NEGATIVE
Candida Glabrata: NEGATIVE
Candida Vaginitis: POSITIVE — AB
Chlamydia: NEGATIVE
Comment: NEGATIVE
Comment: NEGATIVE
Comment: NEGATIVE
Comment: NEGATIVE
Comment: NEGATIVE
Comment: NORMAL
Neisseria Gonorrhea: NEGATIVE
Trichomonas: NEGATIVE

## 2023-07-30 ENCOUNTER — Inpatient Hospital Stay (HOSPITAL_COMMUNITY)
Admission: AD | Admit: 2023-07-30 | Discharge: 2023-07-30 | Disposition: A | Discharge status: Home / Self Care | Payer: Managed Care, Other (non HMO) | Attending: Obstetrics & Gynecology | Admitting: Obstetrics & Gynecology

## 2023-07-30 ENCOUNTER — Encounter (HOSPITAL_COMMUNITY): Payer: Self-pay | Admitting: *Deleted

## 2023-07-30 DIAGNOSIS — Z3200 Encounter for pregnancy test, result unknown: Secondary | ICD-10-CM | POA: Diagnosis not present

## 2023-07-30 DIAGNOSIS — R109 Unspecified abdominal pain: Secondary | ICD-10-CM | POA: Insufficient documentation

## 2023-07-30 DIAGNOSIS — N939 Abnormal uterine and vaginal bleeding, unspecified: Secondary | ICD-10-CM | POA: Diagnosis present

## 2023-07-30 HISTORY — DX: Urinary tract infection, site not specified: N39.0

## 2023-07-30 LAB — HCG, QUANTITATIVE, PREGNANCY: hCG, Beta Chain, Quant, S: <1 m[IU]/mL (ref <5)

## 2023-07-30 LAB — POCT PREGNANCY, URINE: Preg Test, Ur: NEGATIVE

## 2023-07-30 NOTE — MAU Provider Note (Signed)
Chief Complaint: Vaginal Bleeding, Abdominal Pain, and Possible Pregnancy   Event Date/Time   First Provider Initiated Contact with Patient 07/30/23 1821      SUBJECTIVE HPI: Allison Wagner is a 37 y.o. G0P0 at Unknown who presents to maternity admissions reporting passage of tissue/clot.  Patient missed a period on 12/7. Not using contraception currently. Not actively trying to get pregnant but would be okay if it happened. Passed clot/?tissue today. ~2 cm in total. Having heavy cramping prior but stopped after. Now just bleeding lightly. Denies vaginal discharge otherwise, urinary symptoms, abdominal pain currently.  HPI  Past Medical History:  Diagnosis Date   Asthma    Dysphagia 08/12/2008   hospitalization, diagnosed with GERD and enlarged tonsils   GERD (gastroesophageal reflux disease)    Herpes genitalia    type 1   Hx of blood clots    vaginal with menstruation   Hypertension    Polycystic ovarian syndrome 08/12/1993   Routine gynecological examination    Dr. Pennie Rushing   Seasonal allergic rhinitis    Trichomonas    UTI (urinary tract infection)    Yeast infection    Past Surgical History:  Procedure Laterality Date   HYSTEROSCOPY  2023   TONSILECTOMY, ADENOIDECTOMY, BILATERAL MYRINGOTOMY AND TUBES     Social History   Socioeconomic History   Marital status: Single    Spouse name: Not on file   Number of children: Not on file   Years of education: Not on file   Highest education level: Not on file  Occupational History   Not on file  Tobacco Use   Smoking status: Never   Smokeless tobacco: Never  Vaping Use   Vaping status: Never Used  Substance and Sexual Activity   Alcohol use: Yes    Comment: every other wk, social   Drug use: No   Sexual activity: Yes    Birth control/protection: Inserts    Comment: nuva ring   Other Topics Concern   Not on file  Social History Narrative   Single, no children, working as Conservator, museum/gallery for [employer].   Exercise -not much, cardio.  08/2022   Social Drivers of Corporate investment banker Strain: Not on file  Food Insecurity: Not on file  Transportation Needs: Not on file  Physical Activity: Not on file  Stress: Not on file  Social Connections: Not on file  Intimate Partner Violence: Not on file   No current facility-administered medications on file prior to encounter.   Current Outpatient Medications on File Prior to Encounter  Medication Sig Dispense Refill   amLODipine (NORVASC) 5 MG tablet Take 1 tablet (5 mg total) by mouth daily. 90 tablet 2   esomeprazole (NEXIUM) 40 MG capsule Take 1 capsule (40 mg total) by mouth daily. 90 capsule 0   hydrochlorothiazide (HYDRODIURIL) 12.5 MG tablet Take 1 tablet (12.5 mg total) by mouth daily. 90 tablet 2   levalbuterol (XOPENEX HFA) 45 MCG/ACT inhaler Inhale 2 puffs into the lungs every 6 (six) hours as needed for wheezing. 15 g 2   Multiple Vitamins-Minerals (MULTIVITAMIN WITH MINERALS) tablet Take 1 tablet by mouth daily.     tretinoin (RETIN-A) 0.025 % cream Apply topically at bedtime.     diclofenac Sodium (VOLTAREN) 1 % GEL Apply to affected area     fluconazole (DIFLUCAN) 200 MG tablet Take 1 tab p.o. now, may repeat 1 tab p.o. in 3 days if symptoms are not resolved. 7 tablet 0   hydroquinone 4 %  cream Apply topically at bedtime.     ibuprofen (ADVIL) 200 MG tablet Take 800 mg by mouth every 6 (six) hours as needed.     valACYclovir (VALTREX) 500 MG tablet Daily  forprevention, 4 tablets BID x 1 day for flare up (Patient not taking: Reported on 10/08/2022) 30 tablet 3   Allergies  Allergen Reactions   Codeine Other (See Comments)    unsure   Haemophilus Influenzae Vaccines     Got sick with vaccine   Hydrocodone Other (See Comments)    h/a   Labetalol     Somnolence and edema    ROS:  Pertinent positives/negatives listed above.  I have reviewed patient's Past Medical Hx, Surgical Hx, Family Hx, Social Hx, medications and  allergies.   Physical Exam  Patient Vitals for the past 24 hrs:  BP Temp Temp src Pulse Resp SpO2 Weight  07/30/23 1633 (!) 148/96 -- -- 88 -- -- --  07/30/23 1616 (!) 159/98 98.2 F (36.8 C) Oral 93 18 97 % 110.7 kg   Constitutional: Well-developed, well-nourished female in no acute distress Cardiovascular: normal rate Respiratory: normal effort GI: Abd soft, non-tender. Pos BS x 4 MS: Extremities nontender, no edema, normal ROM Neurologic: Alert and oriented x 4    LAB RESULTS Results for orders placed or performed during the hospital encounter of 07/30/23 (from the past 24 hours)  Pregnancy, urine POC     Status: None   Collection Time: 07/30/23  4:28 PM  Result Value Ref Range   Preg Test, Ur NEGATIVE NEGATIVE    IMAGING No results found.  MAU Management/MDM: Orders Placed This Encounter  Procedures   hCG, quantitative, pregnancy   Pregnancy, urine POC   Discharge patient    No orders of the defined types were placed in this encounter.   Patient with passage of clot/possible tissue today. Not on contraception. Is 10 days late for her period. Discussed obtaining an HCG quant for further evaluation. Is possible that she is very early pregnant and miscarried, however also could just be an abnormal period. Patient is HDS at this time and no longer cramping. Discussed going home and conveying results via MyChart. Will need repeat HCG in 48 hours if positive, otherwise will see Dr. Normand Sloop (her OB/GYN) for abnormal period. Miscarriage precautions discussed.  ASSESSMENT 1. Vagina bleeding     PLAN Discharge home with strict return precautions. Allergies as of 07/30/2023       Reactions   Codeine Other (See Comments)   unsure   Haemophilus Influenzae Vaccines    Got sick with vaccine   Hydrocodone Other (See Comments)   h/a   Labetalol    Somnolence and edema        Medication List     TAKE these medications    amLODipine 5 MG tablet Commonly known as:  NORVASC Take 1 tablet (5 mg total) by mouth daily.   diclofenac Sodium 1 % Gel Commonly known as: VOLTAREN Apply to affected area   esomeprazole 40 MG capsule Commonly known as: NEXIUM Take 1 capsule (40 mg total) by mouth daily.   fluconazole 200 MG tablet Commonly known as: DIFLUCAN Take 1 tab p.o. now, may repeat 1 tab p.o. in 3 days if symptoms are not resolved.   hydrochlorothiazide 12.5 MG tablet Commonly known as: HYDRODIURIL Take 1 tablet (12.5 mg total) by mouth daily.   hydroquinone 4 % cream Apply topically at bedtime.   ibuprofen 200 MG tablet Commonly known as:  ADVIL Take 800 mg by mouth every 6 (six) hours as needed.   levalbuterol 45 MCG/ACT inhaler Commonly known as: XOPENEX HFA Inhale 2 puffs into the lungs every 6 (six) hours as needed for wheezing.   multivitamin with minerals tablet Take 1 tablet by mouth daily.   tretinoin 0.025 % cream Commonly known as: RETIN-A Apply topically at bedtime.   valACYclovir 500 MG tablet Commonly known as: VALTREX Daily  forprevention, 4 tablets BID x 1 day for flare up         Wylene Simmer, MD OB Fellow 07/30/2023  6:41 PM

## 2023-07-30 NOTE — MAU Note (Addendum)
Allison Wagner is a 37 y.o. at Unknown here in MAU reporting: tried to get appt at OB/GYN- was told to come here.   Thinks she may be having a miscarriage.  Has had a lot of pressure.  Back pain.  Passed some "things"  was told to bring with here (looks like small clots and tissue). Since she passed this, the back pain and pressure subsided.  Having some mild cramping now in lower abd.  Has a little bleeding on Sun LMP: 11/5, preg was not confirmed anywhere.  Onset of complaint: last night Pain score: mild Vitals:   07/30/23 1616  BP: (!) 159/98  Pulse: 93  Resp: 18  Temp: 98.2 F (36.8 C)  SpO2: 97%      Lab orders placed from triage:  UPT   Pt had not done a test at home

## 2023-09-10 ENCOUNTER — Ambulatory Visit: Payer: Managed Care, Other (non HMO) | Admitting: Medical

## 2023-09-10 ENCOUNTER — Other Ambulatory Visit: Payer: Self-pay | Admitting: Medical

## 2023-09-10 ENCOUNTER — Encounter: Payer: Self-pay | Admitting: Medical

## 2023-09-10 VITALS — BP 120/80 | HR 87 | Ht 62.5 in | Wt 241.4 lb

## 2023-09-10 DIAGNOSIS — E282 Polycystic ovarian syndrome: Secondary | ICD-10-CM

## 2023-09-10 DIAGNOSIS — I10 Essential (primary) hypertension: Secondary | ICD-10-CM | POA: Diagnosis not present

## 2023-09-10 DIAGNOSIS — G8929 Other chronic pain: Secondary | ICD-10-CM

## 2023-09-10 DIAGNOSIS — Z0000 Encounter for general adult medical examination without abnormal findings: Secondary | ICD-10-CM

## 2023-09-10 DIAGNOSIS — K219 Gastro-esophageal reflux disease without esophagitis: Secondary | ICD-10-CM | POA: Diagnosis not present

## 2023-09-10 DIAGNOSIS — Z2820 Immunization not carried out because of patient decision for unspecified reason: Secondary | ICD-10-CM

## 2023-09-10 DIAGNOSIS — E669 Obesity, unspecified: Secondary | ICD-10-CM

## 2023-09-10 DIAGNOSIS — J4520 Mild intermittent asthma, uncomplicated: Secondary | ICD-10-CM

## 2023-09-10 DIAGNOSIS — Z6841 Body Mass Index (BMI) 40.0 and over, adult: Secondary | ICD-10-CM

## 2023-09-10 DIAGNOSIS — Z136 Encounter for screening for cardiovascular disorders: Secondary | ICD-10-CM

## 2023-09-10 DIAGNOSIS — M25561 Pain in right knee: Secondary | ICD-10-CM

## 2023-09-10 DIAGNOSIS — Z131 Encounter for screening for diabetes mellitus: Secondary | ICD-10-CM

## 2023-09-10 DIAGNOSIS — Z13220 Encounter for screening for lipoid disorders: Secondary | ICD-10-CM

## 2023-09-10 LAB — LIPID PANEL

## 2023-09-10 MED ORDER — LEVALBUTEROL TARTRATE 45 MCG/ACT IN AERO: 2.0000 | INHALATION_SPRAY | Freq: Four times a day (QID) | RESPIRATORY_TRACT | 0 refills | Status: DC | PRN

## 2023-09-10 MED ORDER — VALACYCLOVIR HCL 500 MG PO TABS: ORAL_TABLET | ORAL | 3 refills | Status: DC

## 2023-09-10 MED ORDER — AMLODIPINE BESYLATE 5 MG PO TABS: 5.0000 mg | ORAL_TABLET | Freq: Every day | ORAL | 2 refills | Status: DC

## 2023-09-10 MED ORDER — HYDROCHLOROTHIAZIDE 12.5 MG PO TABS: 12.5000 mg | ORAL_TABLET | Freq: Every day | ORAL | 2 refills | Status: DC

## 2023-09-10 MED ORDER — ALBUTEROL SULFATE HFA 108 (90 BASE) MCG/ACT IN AERS: 2.0000 | INHALATION_SPRAY | Freq: Four times a day (QID) | RESPIRATORY_TRACT | 0 refills | Status: DC | PRN

## 2023-09-10 MED ORDER — LEVALBUTEROL HCL 0.63 MG/3ML IN NEBU: 0.6300 mg | INHALATION_SOLUTION | Freq: Once | RESPIRATORY_TRACT | 1 refills | Status: DC

## 2023-09-10 MED ORDER — ESOMEPRAZOLE MAGNESIUM 40 MG PO CPDR: 40.0000 mg | DELAYED_RELEASE_CAPSULE | Freq: Every day | ORAL | 2 refills | Status: DC

## 2023-09-10 NOTE — Progress Notes (Signed)
Pt will be by tomorrow

## 2023-09-10 NOTE — Assessment & Plan Note (Signed)
Continue Xopenex as needed but if having to use inhaler more frequently then we will need to recheck and discussed maintenance preventative inhaler

## 2023-09-10 NOTE — Progress Notes (Signed)
Complete physical exam  Patient: Allison Wagner   DOB: 1986-07-26   38 y.o. Female  MRN: [id number]  Subjective:    Chief Complaint  Patient presents with   Annual Exam    Fasting cpe, feels her bp is elevated possible due to stress   Patient Care Team: Garvey Westcott, Kermit Balo, PA-C as PCP - General (Family Medicine) Basilio Cairo, MD (Dermatology) Jaymes Graff, MD as Consulting Physician (Obstetrics and Gynecology) Orthodontist- Dr. Greer Pickerel Dentist- Dr. Alfonso Ellis- [facility]   Allison Wagner is a 38 y.o. female who presents today for a complete physical exam.  Asthma - gets flare ups sometimes , maybe few times a week.  Needs refill on inhaler and possible getting nebulizer again.  Still has problems with right knee that she injured last year  Hx/o abnormal sleep study.  Never tried CPAP.   Most recent fall risk assessment:    09/10/2023    9:49 AM  Fall Risk   Falls in the past year? 0  Number falls in past yr: 0  Injury with Fall? 0  Risk for fall due to : No Fall Risks  Follow up Falls evaluation completed     Most recent depression screenings:    09/10/2023    9:49 AM 09/06/2022    1:54 PM  PHQ 2/9 Scores  PHQ - 2 Score 0 0    Vision:Within last year and Dental: No current dental problems.  Just recently got braces.  Patient Active Problem List   Diagnosis Date Noted   Encounter for health maintenance examination in adult 09/06/2022   Gastroesophageal reflux disease 11/23/2020   BMI 40.0-44.9, adult (HCC) 11/23/2020   Genital herpes 10/25/2015   Asthma, mild intermittent 10/25/2015   Essential hypertension 10/25/2015   Screening for diabetes mellitus 10/25/2015   Obesity 09/20/2015   PCOS (polycystic ovarian syndrome) 01/08/2012   Past Medical History:  Diagnosis Date   Asthma    Dysphagia 08/12/2008   hospitalization, diagnosed with GERD and enlarged tonsils   GERD (gastroesophageal reflux disease)    Herpes genitalia    type 1   Hx of  blood clots    vaginal with menstruation   Hypertension    Polycystic ovarian syndrome 08/12/1993   Routine gynecological examination    Dr. Pennie Rushing   Seasonal allergic rhinitis    Trichomonas    UTI (urinary tract infection)    Yeast infection    Past Surgical History:  Procedure Laterality Date   HYSTEROSCOPY  2023   TONSILECTOMY, ADENOIDECTOMY, BILATERAL MYRINGOTOMY AND TUBES     Social History   Tobacco Use   Smoking status: Never   Smokeless tobacco: Never  Vaping Use   Vaping status: Never Used  Substance Use Topics   Alcohol use: Yes    Comment: occasional   Drug use: No   Family History  Problem Relation Age of Onset   Hypertension Mother    Migraines Mother    Fibroids Mother    Sleep apnea Mother    Hypertension Father    Migraines Brother    Hypertension Brother    Sleep apnea Brother    Cancer Maternal Aunt        lung   Heart disease Maternal Grandfather        MI   Cancer Paternal Grandmother        breast   Allergies  Allergen Reactions   Codeine Other (See Comments)    unsure   Hydrocodone Other (See Comments)  h/a   Influenza Vaccines    Labetalol     Somnolence and edema      Outpatient Medications Prior to Visit  Medication Sig   BLACK CURRANT SEED OIL PO Take by mouth.   KRILL OIL PO Take by mouth.   Magnesium Hydroxide (MAGNESIA PO) Take by mouth.   Multiple Vitamins-Minerals (MULTIVITAMIN WITH MINERALS) tablet Take 1 tablet by mouth daily.   tretinoin (RETIN-A) 0.025 % cream Apply topically at bedtime.   [DISCONTINUED] amLODipine (NORVASC) 5 MG tablet Take 1 tablet (5 mg total) by mouth daily.   [DISCONTINUED] diclofenac Sodium (VOLTAREN) 1 % GEL Apply to affected area   [DISCONTINUED] hydrochlorothiazide (HYDRODIURIL) 12.5 MG tablet Take 1 tablet (12.5 mg total) by mouth daily.   [DISCONTINUED] esomeprazole (NEXIUM) 40 MG capsule Take 1 capsule (40 mg total) by mouth daily. (Patient not taking: Reported on 09/10/2023)    [DISCONTINUED] fluconazole (DIFLUCAN) 200 MG tablet Take 1 tab p.o. now, may repeat 1 tab p.o. in 3 days if symptoms are not resolved.   [DISCONTINUED] hydroquinone 4 % cream Apply topically at bedtime.   [DISCONTINUED] ibuprofen (ADVIL) 200 MG tablet Take 800 mg by mouth every 6 (six) hours as needed. (Patient not taking: Reported on 09/10/2023)   [DISCONTINUED] levalbuterol (XOPENEX HFA) 45 MCG/ACT inhaler Inhale 2 puffs into the lungs every 6 (six) hours as needed for wheezing. (Patient not taking: Reported on 09/10/2023)   [DISCONTINUED] valACYclovir (VALTREX) 500 MG tablet Daily  forprevention, 4 tablets BID x 1 day for flare up (Patient not taking: Reported on 09/10/2023)   No facility-administered medications prior to visit.    Review of Systems  Constitutional:  Negative for chills, fever, malaise/fatigue and weight loss.  HENT:  Negative for congestion, ear pain, hearing loss, sore throat and tinnitus.   Eyes:  Negative for blurred vision, pain and redness.  Respiratory:  Positive for shortness of breath. Negative for cough and hemoptysis.   Cardiovascular:  Negative for chest pain, palpitations, orthopnea, claudication and leg swelling.  Gastrointestinal:  Negative for abdominal pain, blood in stool, constipation, diarrhea, nausea and vomiting.  Genitourinary:  Negative for dysuria, flank pain, frequency, hematuria and urgency.  Musculoskeletal:  Positive for joint pain. Negative for falls and myalgias.  Skin:  Negative for itching and rash.  Neurological:  Negative for dizziness, tingling, speech change, weakness and headaches.  Endo/Heme/Allergies:  Negative for polydipsia. Does not bruise/bleed easily.  Psychiatric/Behavioral:  Negative for depression and memory loss. The patient is not nervous/anxious and does not have insomnia.         Objective:     BP 120/80   Pulse 87   Ht 5' 2.5" (1.588 m)   Wt 241 lb 6.4 oz (109.5 kg)   SpO2 98%   BMI 43.45 kg/m  BP Readings from  Last 3 Encounters:  09/10/23 120/80  07/30/23 (!) 148/96  06/14/23 (!) 145/85   Wt Readings from Last 3 Encounters:  09/10/23 241 lb 6.4 oz (109.5 kg)  07/30/23 244 lb 1.6 oz (110.7 kg)  10/08/22 242 lb (109.8 kg)      Physical Exam Vitals and nursing note reviewed.  Constitutional:      General: She is not in acute distress.    Appearance: Normal appearance. She is not ill-appearing.  HENT:     Head: Normocephalic and atraumatic.     Right Ear: External ear normal.     Left Ear: External ear normal.     Nose: Nose normal.  Mouth/Throat:     Mouth: Mucous membranes are moist.     Pharynx: Oropharynx is clear.  Eyes:     Extraocular Movements: Extraocular movements intact.     Conjunctiva/sclera: Conjunctivae normal.     Pupils: Pupils are equal, round, and reactive to light.  Neck:     Vascular: No carotid bruit.  Cardiovascular:     Rate and Rhythm: Normal rate and regular rhythm.     Pulses: Normal pulses.     Heart sounds: Normal heart sounds. No murmur heard. Pulmonary:     Effort: Pulmonary effort is normal. No respiratory distress.     Breath sounds: Normal breath sounds. No wheezing or rales.  Abdominal:     General: Bowel sounds are normal. There is no distension.     Palpations: Abdomen is soft. There is no mass.     Tenderness: There is no abdominal tenderness.     Hernia: No hernia is present.  Musculoskeletal:        General: No swelling, tenderness or deformity. Normal range of motion.     Cervical back: Normal range of motion and neck supple. No tenderness.     Right lower leg: No edema.     Left lower leg: No edema.  Lymphadenopathy:     Cervical: No cervical adenopathy.  Skin:    General: Skin is warm and dry.     Capillary Refill: Capillary refill takes less than 2 seconds.     Findings: No rash.  Neurological:     Mental Status: She is alert and oriented to person, place, and time. Mental status is at baseline.     Cranial Nerves: No  cranial nerve deficit.     Sensory: No sensory deficit.     Motor: No weakness.     Gait: Gait normal.     Deep Tendon Reflexes: Reflexes normal.  Psychiatric:        Mood and Affect: Mood normal.        Behavior: Behavior normal.        Judgment: Judgment normal.       Assessment & Plan:    Routine Health Maintenance and Physical Exam  Immunization History  Administered Date(s) Administered   DTaP 10/30/1987, 07/08/1988, 10/14/1988, 07/20/1990, 08/18/1991   HIB (PRP-OMP) 10/21/1988   HPV Quadrivalent 08/15/2005, 03/13/2006   IPV 10/30/1987, 07/08/1988, 07/20/1990, 08/18/1991   Influenza,inj,Quad PF,6+ Mos 04/30/2013   MMR 10/14/1988, 08/18/1991   Meningococcal Conjugate 12/11/2005   Td 09/06/2022   Tdap 02/10/2013    Health Maintenance  Topic Date Due   COVID-19 Vaccine (1 - 2024-25 season) 09/26/2023 (Originally 04/13/2023)   INFLUENZA VACCINE  11/10/2023 (Originally 03/13/2023)   Cervical Cancer Screening (HPV/Pap Cotest)  11/23/2024   DTaP/Tdap/Td (8 - Td or Tdap) 09/06/2032   Hepatitis C Screening  Completed   HIV Screening  Completed   Pneumococcal Vaccine 52-12 Years old  Discontinued   HPV VACCINES  Discontinued    Discussed health benefits of physical activity, and encouraged her to engage in regular exercise appropriate for her age and condition.  Problem List Items Addressed This Visit     PCOS (polycystic ovarian syndrome)   Obesity   Relevant Orders   Comprehensive metabolic panel   TSH   Hemoglobin A1c   Asthma, mild intermittent   Continue Xopenex as needed but if having to use inhaler more frequently then we will need to recheck and discussed maintenance preventative inhaler      Relevant Medications  levalbuterol (XOPENEX HFA) 45 MCG/ACT inhaler   levalbuterol (XOPENEX) 0.63 MG/3ML nebulizer solution   Essential hypertension   Continue Amlodipine and HCT      Relevant Medications   amLODipine (NORVASC) 5 MG tablet   hydrochlorothiazide  (HYDRODIURIL) 12.5 MG tablet   Screening for diabetes mellitus   Relevant Orders   Hemoglobin A1c   Gastroesophageal reflux disease   Relevant Medications   Magnesium Hydroxide (MAGNESIA PO)   esomeprazole (NEXIUM) 40 MG capsule   BMI 40.0-44.9, adult (HCC)   Counseled on diet, exercise, and need to lose weight      Encounter for health maintenance examination in adult - Primary   Relevant Orders   Comprehensive metabolic panel   CBC with Differential/Platelet   TSH   Hemoglobin A1c   POCT Urinalysis DIP (Proadvantage Device)   Ambulatory referral to Sports Medicine   Lipid panel   Other Visit Diagnoses       Vaccine refused by patient         Encounter for lipid screening for cardiovascular disease       Relevant Orders   Lipid panel     Chronic pain of right knee       Relevant Orders   Ambulatory referral to Sports Medicine      Return for CPX physical fasting.     Kristian Covey, PA-C

## 2023-09-10 NOTE — Assessment & Plan Note (Signed)
Counseled on diet, exercise, and need to lose weight

## 2023-09-10 NOTE — Assessment & Plan Note (Signed)
Continue Amlodipine and HCT

## 2023-09-11 LAB — CBC WITH DIFFERENTIAL/PLATELET
Basophils Absolute: 0 10*3/uL (ref 0.0–0.2)
Basos: 0 %
EOS (ABSOLUTE): 0.1 10*3/uL (ref 0.0–0.4)
Eos: 1 %
Hematocrit: 35.6 % (ref 34.0–46.6)
Hemoglobin: 11.3 g/dL (ref 11.1–15.9)
Immature Grans (Abs): 0 10*3/uL (ref 0.0–0.1)
Immature Granulocytes: 0 %
Lymphocytes Absolute: 2.5 10*3/uL (ref 0.7–3.1)
Lymphs: 35 %
MCH: 24.8 pg — ABNORMAL LOW (ref 26.6–33.0)
MCHC: 31.7 g/dL (ref 31.5–35.7)
MCV: 78 fL — ABNORMAL LOW (ref 79–97)
Monocytes Absolute: 0.4 10*3/uL (ref 0.1–0.9)
Monocytes: 5 %
Neutrophils Absolute: 4.2 10*3/uL (ref 1.4–7.0)
Neutrophils: 59 %
Platelets: 498 10*3/uL — ABNORMAL HIGH (ref 150–450)
RBC: 4.55 x10E6/uL (ref 3.77–5.28)
RDW: 12.8 % (ref 11.7–15.4)
WBC: 7.3 10*3/uL (ref 3.4–10.8)

## 2023-09-11 LAB — LIPID PANEL
Cholesterol, Total: 108 mg/dL (ref 100–199)
HDL: 46 mg/dL (ref >39)
LDL CALC COMMENT:: 2.3 ratio (ref 0.0–4.4)
LDL Chol Calc (NIH): 42 mg/dL (ref 0–99)
Triglycerides: 112 mg/dL (ref 0–149)
VLDL Cholesterol Cal: 20 mg/dL (ref 5–40)

## 2023-09-11 LAB — COMPREHENSIVE METABOLIC PANEL
ALT: 16 IU/L (ref 0–32)
AST: 14 IU/L (ref 0–40)
Albumin: 4.5 g/dL (ref 3.9–4.9)
Alkaline Phosphatase: 114 IU/L (ref 44–121)
BUN/Creatinine Ratio: 14 (ref 9–23)
BUN: 12 mg/dL (ref 6–20)
Bilirubin Total: 0.3 mg/dL (ref 0.0–1.2)
CO2: 22 mmol/L (ref 20–29)
Calcium: 9.5 mg/dL (ref 8.7–10.2)
Chloride: 97 mmol/L (ref 96–106)
Creatinine, Ser: 0.86 mg/dL (ref 0.57–1.00)
Globulin, Total: 2.9 g/dL (ref 1.5–4.5)
Glucose: 95 mg/dL (ref 70–99)
Potassium: 4.4 mmol/L (ref 3.5–5.2)
Sodium: 137 mmol/L (ref 134–144)
Total Protein: 7.4 g/dL (ref 6.0–8.5)
eGFR: 89 mL/min/{1.73_m2} (ref >59)

## 2023-09-11 LAB — HEMOGLOBIN A1C
Est. average glucose Bld gHb Est-mCnc: 108 mg/dL
Hgb A1c MFr Bld: 5.4 % (ref 4.8–5.6)

## 2023-09-11 LAB — TSH: TSH: 3.03 u[IU]/mL (ref 0.450–4.500)

## 2023-09-11 NOTE — Progress Notes (Signed)
Results sent through MyChart

## 2023-09-12 ENCOUNTER — Other Ambulatory Visit: Payer: Self-pay | Admitting: Medical

## 2023-09-12 DIAGNOSIS — R7989 Other specified abnormal findings of blood chemistry: Secondary | ICD-10-CM

## 2023-09-17 ENCOUNTER — Ambulatory Visit
Admission: RE | Admit: 2023-09-17 | Discharge: 2023-09-17 | Disposition: A | Discharge status: Home / Self Care | Payer: Managed Care, Other (non HMO) | Source: Ambulatory Visit | Attending: Family Medicine | Admitting: Family Medicine

## 2023-09-17 ENCOUNTER — Encounter: Payer: Self-pay | Admitting: Family Medicine

## 2023-09-17 ENCOUNTER — Ambulatory Visit (INDEPENDENT_AMBULATORY_CARE_PROVIDER_SITE_OTHER): Payer: Managed Care, Other (non HMO) | Admitting: Family Medicine

## 2023-09-17 VITALS — BP 138/88 | Ht 62.0 in | Wt 241.0 lb

## 2023-09-17 DIAGNOSIS — M7651 Patellar tendinitis, right knee: Secondary | ICD-10-CM | POA: Insufficient documentation

## 2023-09-17 NOTE — Progress Notes (Signed)
  Allison Wagner - 38 y.o. female MRN [id number]  Date of birth: 06-Mar-1986  PCP: Bulah Alm RAMAN, PA-C  Subjective:  No chief complaint on file. Right knee pain  HPI: Past Medical, Surgical, Social, and Family History Reviewed & Updated per EMR.   Patient is a 38 y.o. female here for chronic pain in the right knee.  She initially injured it a year ago after hitting it on the corner of a glass table.  She was evaluated for a contusion on the knee and was given conservative symptomatic treatment.  Her initial swelling and pain improved as well as her range of motion but since then she has had some ongoing popping with discomfort, particularly on the medial side of the knee.  She has not had any mechanical locking, fevers, chills, recurrent swelling, warmth, numbness, weakness.  She has been trying to increase her physical activity for weight loss and health benefits and believes the discomfort may be interfering with her lifestyle.  The pain is more concerning for her rather than creating a physical barrier.   Past Surgical History:  Procedure Laterality Date   HYSTEROSCOPY  2023   TONSILECTOMY, ADENOIDECTOMY, BILATERAL MYRINGOTOMY AND TUBES      Allergies  Allergen Reactions   Codeine Other (See Comments)    unsure   Hydrocodone Other (See Comments)    h/a   Influenza Vaccines    Labetalol      Somnolence and edema        Objective:  Physical Exam: VS: BP:138/88  HR: bpm  TEMP: ( )  RESP:   HT:5' 2 (157.5 cm)   WT:241 lb (109.3 kg)  BMI:44.07  Gen: Well developed, NAD, speaks clearly, comfortable in exam room Respiratory: Normal work of breathing on room air, no respiratory distress Skin: No rashes, abrasions, or ecchymosis MSK:  Right knee: Inspection: No ecchymosis, soft tissue edema, erythema.  There is genu valgus formation ROM: Full Strength: 5/5 Palpation w/ no warmth, no effusion present, no posterior tenderness, there is crepitation appreciated with palpation  over the patellar ligament, particularly with flexion and extension. Mild medial joint line tenderness to palpation  no tenderness over Gerdy's tubercle, pes bursa some tenderness over the tibial tuberosity Inferior pole patellar tenderness, translation nontender no condyle tenderness. Special Tests: Varus and valgus stress are normal with good endpoints Collateral ligaments: Lachman's negative Meniscus: McMurray negative Posterior Rotation Recurvatum Test (posterolateral capsule instability) negative Neuro: No sensory deficits Gait: Normal   Assessment & Plan:   Patellar tendonitis of right knee - The patient's previous trauma with contusion to the knee has healed but she has some ongoing discomfort.  The excess swelling and inflammation is possible distally to her current patella tendinitis. - The patient will start VMO strengthening exercises and quadriceps eccentric stregthening/stretching.  After discussing all the therapeutic options and joint decision making, she will try ice and voltaren gel conservatively first - Xray ordered and we will follow up on the results.  Unfortunately we cannot view her previous imaging at [facility], and she reports this was only 1 view - She can return as soon as her schedule allows for a diagnostic ultrasound of the knee    Lonni Ford MD [facility] Sports [facility] Fellow

## 2023-09-17 NOTE — Assessment & Plan Note (Addendum)
-   The patient's previous trauma with contusion to the knee has healed but she has some ongoing discomfort.  The excess swelling and inflammation is possible distally to her current patella tendinitis. - The patient will start VMO strengthening exercises and quadriceps eccentric stregthening/stretching.  After discussing all the therapeutic options and joint decision making, she will try ice and voltaren gel conservatively first - Xray ordered and we will follow up on the results.  Unfortunately we cannot view her previous imaging at [facility], and she reports this was only 1 view - She can return as soon as her schedule allows for a diagnostic ultrasound of the knee

## 2023-09-18 ENCOUNTER — Encounter: Payer: Self-pay | Admitting: Internal Medicine

## 2023-09-24 ENCOUNTER — Encounter: Payer: Self-pay | Admitting: Family Medicine

## 2023-09-24 ENCOUNTER — Ambulatory Visit (INDEPENDENT_AMBULATORY_CARE_PROVIDER_SITE_OTHER): Payer: Managed Care, Other (non HMO) | Admitting: Family Medicine

## 2023-09-24 ENCOUNTER — Other Ambulatory Visit: Payer: Self-pay

## 2023-09-24 VITALS — BP 142/100 | Ht 62.0 in | Wt 241.0 lb

## 2023-09-24 DIAGNOSIS — M7651 Patellar tendinitis, right knee: Secondary | ICD-10-CM

## 2023-09-24 NOTE — Progress Notes (Unsigned)
PCP: Jac Canavan, PA-C  SUBJECTIVE:   HPI:  Patient is a 38 y.o. female here for follow up of right knee pain. One year ago ran into coffee table. Had anterior knee pain for a few months, mostly has resolved now. Reports intermittent aching pain medially and anteriorly. Denies instability, locking, catching. She is not taking anything for pain. She did not do her home exercises. No previous injuries to her right knee.  Pertinent ROS were reviewed with the patient and found to be negative unless otherwise specified above in HPI.   PERTINENT  PMH / PSH / FH / SH:  Past Medical, Surgical, Social, and Family History Reviewed & Updated in the EMR.  Pertinent findings include:    Past Medical History:  Diagnosis Date   Asthma    Dysphagia 08/12/2008   hospitalization, diagnosed with GERD and enlarged tonsils   GERD (gastroesophageal reflux disease)    Herpes genitalia    type 1   Hx of blood clots    vaginal with menstruation   Hypertension    Polycystic ovarian syndrome 08/12/1993   Routine gynecological examination    Dr. Pennie Rushing   Seasonal allergic rhinitis    Trichomonas    UTI (urinary tract infection)    Yeast infection     Current Outpatient Medications on File Prior to Visit  Medication Sig Dispense Refill   albuterol (VENTOLIN HFA) 108 (90 Base) MCG/ACT inhaler Inhale 2 puffs into the lungs every 6 (six) hours as needed for wheezing or shortness of breath. 18 g 0   amLODipine (NORVASC) 5 MG tablet Take 1 tablet (5 mg total) by mouth daily. 90 tablet 2   BLACK CURRANT SEED OIL PO Take by mouth.     esomeprazole (NEXIUM) 40 MG capsule Take 1 capsule (40 mg total) by mouth daily. 90 capsule 2   hydrochlorothiazide (HYDRODIURIL) 12.5 MG tablet Take 1 tablet (12.5 mg total) by mouth daily. 90 tablet 2   KRILL OIL PO Take by mouth.     levalbuterol (XOPENEX HFA) 45 MCG/ACT inhaler Inhale 2 puffs into the lungs every 6 (six) hours as needed for wheezing. 15 g 0    levalbuterol (XOPENEX) 0.63 MG/3ML nebulizer solution Take 3 mLs (0.63 mg total) by nebulization once for 1 dose. 3 mL 1   Magnesium Hydroxide (MAGNESIA PO) Take by mouth.     Multiple Vitamins-Minerals (MULTIVITAMIN WITH MINERALS) tablet Take 1 tablet by mouth daily.     tretinoin (RETIN-A) 0.025 % cream Apply topically at bedtime.     valACYclovir (VALTREX) 500 MG tablet Daily  forprevention, 4 tablets BID x 1 day for flare up 30 tablet 3   No current facility-administered medications on file prior to visit.    Past Surgical History:  Procedure Laterality Date   HYSTEROSCOPY  2023   TONSILECTOMY, ADENOIDECTOMY, BILATERAL MYRINGOTOMY AND TUBES      Allergies  Allergen Reactions   Codeine Other (See Comments)    unsure   Hydrocodone Other (See Comments)    h/a   Influenza Vaccines    Labetalol     Somnolence and edema    OBJECTIVE:  BP (!) 142/100   Ht 5\' 2"  (1.575 m)   Wt 241 lb (109.3 kg)   BMI 44.08 kg/m   PHYSICAL EXAM:  GEN: Alert and Oriented, NAD, comfortable in exam room RESP: Unlabored respirations, symmetric chest rise PSY: normal mood, congruent affect   MSK EXAM: Knee, right: minimal TTP noted at the patellar  tendon. Inspection was negative for erythema, ecchymosis, and effusion. No obvious bony abnormalities or signs of osteophyte development. Palpation yielded no asymmetric warmth; Minimal medial joint line tenderness; No condyle tenderness; No knee crepitus. Patellar and quadriceps tendons unremarkable, and no tenderness of the pes anserine bursa. No obvious Baker's cyst development. ROM normal in flexion (135 degrees) and extension (0 degrees). Strength 5/5 with knee flexion and extension. Neurovascularly intact.   Provocative Testing:    - Patella:   - Patellar grind/compression: NEG   - Patellar glide: Appropriate medial/lateral glide without apprehension   - Clark's test: negative - Cruciate Ligaments:   - Anterior Drawer/Lachman test: NEG -  Posterior Drawer: NEG  - Collateral Ligaments:   - Varus/Valgus (MCL/LCL) Stress test at 0, 15d: NEG   - Apley's Compression/Distraction test: NEG  - Meniscus:   - Thessaly: NEG   - McMurray's: NEG   Ultrasound: On ultrasound proximal and distal insertion of patellar tendon with minimal osteophytes on proximal patella and tibial tuberosity. She has moderate edema throughout the patellar tendon. Minimal calcification noted throughout the tendon c/w tendinitis.  Reviewed with Dr. Webb Silversmith Assessment & Plan Patellar tendonitis of right knee She continues to have some anterior knee pain. Did not do home exercises. On ultrasound proximal and distal insertion of patellar tendon with minimal osteophytes on proximal patella and tibial tuberosity. She has moderate edema throughout the patellar tendon. Minimal calcification noted throughout the tendon c/w tendinitis. Reviewed x-ray obtained at last visit unremarkable. Overall history and exam c/w patellar tendonitis. We discussed treatment options including PT, nitro patches, and shockwave. Will start with PT and consider other options if not improving.  - referral for PT - follow up as needed    Micah Noel MD PGY2 [facility]  Fellow attestation:  I was present for the evaluation and exam.  I agree with the above assessment and plan.  On physical exam there is some mild tenderness over the patellar ligament but otherwise normal.  She does still have some lateral tracking of the patella.  Ultrasound of the right knee No effusion in the suprapatellar pouch Patellar and quadriceps tendons have some scattered, linear, hypoechoic changes within the tendon at the distal aspect of the quadriceps tendon and distal insertion of the patellar ligament.  No surrounding edema or cortical irregularities. Lateral meniscus is intact with no extrusion along the lateral joint line Medial meniscus is intact, joint line is not narrowed with out spurring    Summary: Consistent with quadriceps and patella tendinitis  Ultrasound and interpretation by Dr. Webb Silversmith and Dr. Christella Hartigan  We will follow-up in 4 weeks to reevaluate.  If she is feeling completely better she can cancel her appointment.

## 2023-09-24 NOTE — Assessment & Plan Note (Addendum)
She continues to have some anterior knee pain. Did not do home exercises. On ultrasound proximal and distal insertion of patellar tendon with minimal osteophytes on proximal patella and tibial tuberosity. She has moderate edema throughout the patellar tendon. Minimal calcification noted throughout the tendon c/w tendinitis. Reviewed x-ray obtained at last visit unremarkable. Overall history and exam c/w patellar tendonitis. We discussed treatment options including PT, nitro patches, and shockwave. Will start with PT and consider other options if not improving.  - referral for PT - follow up as needed

## 2023-10-17 ENCOUNTER — Other Ambulatory Visit: Payer: Self-pay | Admitting: Medical

## 2023-10-17 NOTE — Telephone Encounter (Signed)
 Pt has an appt next year for cpe

## 2023-12-09 ENCOUNTER — Telehealth: Payer: Self-pay

## 2023-12-09 ENCOUNTER — Ambulatory Visit: Attending: Family Medicine

## 2023-12-09 NOTE — Telephone Encounter (Signed)
 Patient called and cancelled appointment not feeling well.

## 2024-09-16 ENCOUNTER — Encounter: Payer: Self-pay | Admitting: Medical

## 2024-09-16 ENCOUNTER — Ambulatory Visit: Payer: Managed Care, Other (non HMO) | Admitting: Medical

## 2024-09-16 VITALS — BP 132/88 | HR 100 | Ht 62.5 in | Wt 245.4 lb

## 2024-09-16 DIAGNOSIS — K219 Gastro-esophageal reflux disease without esophagitis: Secondary | ICD-10-CM | POA: Diagnosis not present

## 2024-09-16 DIAGNOSIS — Z0000 Encounter for general adult medical examination without abnormal findings: Secondary | ICD-10-CM | POA: Diagnosis not present

## 2024-09-16 DIAGNOSIS — G4733 Obstructive sleep apnea (adult) (pediatric): Secondary | ICD-10-CM | POA: Insufficient documentation

## 2024-09-16 DIAGNOSIS — Z13220 Encounter for screening for lipoid disorders: Secondary | ICD-10-CM

## 2024-09-16 DIAGNOSIS — J4520 Mild intermittent asthma, uncomplicated: Secondary | ICD-10-CM | POA: Diagnosis not present

## 2024-09-16 DIAGNOSIS — R635 Abnormal weight gain: Secondary | ICD-10-CM | POA: Insufficient documentation

## 2024-09-16 DIAGNOSIS — Z6841 Body Mass Index (BMI) 40.0 and over, adult: Secondary | ICD-10-CM

## 2024-09-16 DIAGNOSIS — F419 Anxiety disorder, unspecified: Secondary | ICD-10-CM | POA: Diagnosis not present

## 2024-09-16 DIAGNOSIS — Z23 Encounter for immunization: Secondary | ICD-10-CM

## 2024-09-16 DIAGNOSIS — I10 Essential (primary) hypertension: Secondary | ICD-10-CM

## 2024-09-16 DIAGNOSIS — E282 Polycystic ovarian syndrome: Secondary | ICD-10-CM

## 2024-09-16 MED ORDER — ALBUTEROL SULFATE HFA 108 (90 BASE) MCG/ACT IN AERS: 2.0000 | INHALATION_SPRAY | Freq: Four times a day (QID) | RESPIRATORY_TRACT | 1 refills | Status: AC | PRN

## 2024-09-16 MED ORDER — ESOMEPRAZOLE MAGNESIUM 40 MG PO CPDR: 40.0000 mg | DELAYED_RELEASE_CAPSULE | Freq: Every day | ORAL | 2 refills | Status: AC

## 2024-09-16 MED ORDER — AMLODIPINE BESYLATE 10 MG PO TABS: 10.0000 mg | ORAL_TABLET | Freq: Every day | ORAL | 3 refills | Status: AC

## 2024-09-16 MED ORDER — HYDROCHLOROTHIAZIDE 12.5 MG PO TABS: 12.5000 mg | ORAL_TABLET | Freq: Every day | ORAL | 3 refills | Status: AC

## 2024-09-16 NOTE — Progress Notes (Signed)
 "  Name: Allison Wagner   Date of Visit: 09/16/24   CHIEF COMPLAINT:  Chief Complaint  Patient presents with   Annual Exam    Cpe, having some anxiety attacks in back in December. Went to UC back in January due to panic attack and had EKG- normal.        HPI:  Discussed the use of AI scribe software for clinical note transcription with the patient, who gave verbal consent to proceed.  History of Present Illness  Allison Wagner is a 39 year old female who presents for a well visit.  She takes amlodipine  5 mg and hydrochlorothiazide  12.5 mg daily for hypertension. Her home blood pressure readings are uncertain due to concerns about the accuracy of her monitor.  She has a history of polycystic ovarian syndrome (PCOS) and is exploring options for GLP-1 medications, which were previously not covered by her insurance.  She underwent a sleep study in 2017, which indicated moderate sleep apnea with 18 events per hour. She has not used a CPAP machine and is considering options for managing her sleep apnea, including weight loss and other treatments.  She experiences a recurrent, intermittent burning sensation on her left side, described as 'itchy' and internal, occurring every few months. No associated rash has been noted by the patient.  Her current medications include albuterol  as needed, amlodipine  5 mg, hydrochlorothiazide  12.5 mg, Nexium  40 mg, a multivitamin, and over-the-counter vitamin D3 with K2. She is allergic to codeine, hydrocodone, labetalol , and the flu shot.  She does not smoke and consumes alcohol occasionally. She lives alone and works for an scientist, [employer]. For exercise, she walks and uses a weighted vest at home.  Lately had an episode of anxiety at work, none since but this bothered her.  No depressed mood, no SI.    Allergies[1]  Past Medical History:  Diagnosis Date   Asthma    Dysphagia 08/12/2008   hospitalization, diagnosed with GERD and enlarged tonsils    GERD (gastroesophageal reflux disease)    Herpes genitalia    type 1   Hx of blood clots    vaginal with menstruation   Hypertension    Polycystic ovarian syndrome 08/12/1993   Routine gynecological examination    Dr. Raeanne   Seasonal allergic rhinitis    Trichomonas    UTI (urinary tract infection)    Yeast infection     Medications Ordered Prior to Encounter[2]   Current Medications[3]  Family History  Problem Relation Age of Onset   Hypertension Mother    Migraines Mother    Fibroids Mother    Sleep apnea Mother    Hypertension Father    Migraines Brother    Hypertension Brother    Sleep apnea Brother    Cancer Maternal Aunt        lung   Heart disease Maternal Grandfather        MI   Cancer Paternal Grandmother        breast    Past Surgical History:  Procedure Laterality Date   HYSTEROSCOPY  2023   TONSILECTOMY, ADENOIDECTOMY, BILATERAL MYRINGOTOMY AND TUBES       ROS as in subjective     OBJECTIVE:    BP 132/88   Pulse 100   Ht 5' 2.5 (1.588 m)   Wt 245 lb 6.4 oz (111.3 kg)   SpO2 98%   BMI 44.17 kg/m    Wt Readings from Last 3 Encounters:  09/16/24 245 lb 6.4 oz (  111.3 kg)  09/24/23 241 lb (109.3 kg)  09/17/23 241 lb (109.3 kg)   BP Readings from Last 3 Encounters:  09/16/24 132/88  09/24/23 (!) 142/100  09/17/23 138/88    General appearence: alert, no distress, WD/WN, African American female HEENT: normocephalic, sclerae anicteric, PERRLA, EOMi, nares patent, no discharge or erythema, pharynx normal Oral cavity: MMM, no lesions Neck: supple, no lymphadenopathy, no thyromegaly, no masses, no bruits Heart: RRR, normal S1, S2, no murmurs Lungs: CTA bilaterally, no wheezes, rhonchi, or rales Abdomen: +bs, soft, few scattered scars of the abdomen from prior keloids from chickenpox, non tender, non distended, no masses, no hepatomegaly, no splenomegaly Extremities: no edema, no cyanosis, no clubbing Pulses: 2+ symmetric, upper and  lower extremities, normal cap refill MSK: Upper and lower extremities unremarkable, limited exam Breast again deferred to gynecology    ASSESSMENT/PLAN:   Encounter Diagnoses  Name Primary?   Encounter for health maintenance examination in adult Yes   BMI 40.0-44.9, adult (HCC)    Mild intermittent asthma without complication    Essential hypertension    Gastroesophageal reflux disease, unspecified whether esophagitis present    PCOS (polycystic ovarian syndrome)    Weight gain    Screening for lipid disorders    OSA (obstructive sleep apnea)    Need for HPV vaccination    Anxiety     Woman's Wellness Visit Routine wellness visit. Discussed general health maintenance, including vaccinations and screenings. - Encouraged scheduling a Pap smear with gynecologist. - Discussed mammogram due in August. - Discussed colon cancer screening starting at age 61.  Essential hypertension Blood pressure borderline controlled on amlodipine  5 mg and hydrochlorothiazide  12.5 mg. Discussed increasing amlodipine  to improve control. - Increased amlodipine  to 10 mg daily.  Obesity, BMI 40.0-44.9 Discussed weight management options, including GLP-1 medications and weight loss studies. Insurance coverage for GLP-1 medications is uncertain. Discussed potential benefits of weight loss for overall health and management of comorbid conditions. - Consider GLP-1 medications if insurance covers. - Consider participation in weight loss study for potential benefits.  Obstructive sleep apnea Moderate obstructive sleep apnea diagnosed in 2017 with 18 events per hour. Discussed risks of untreated sleep apnea, including stroke and heart attack. Discussed treatment options including CPAP, oral airway devices, and the INSPIRE procedure. Emphasized importance of weight loss in management. - Consider repeat sleep study if pursuing CPAP or other treatments. - Encouraged weight loss as primary management  strategy.  Mild intermittent asthma Asthma managed with albuterol  as needed. - Continue albuterol  as needed.  Anxiety  - This is an issue at the very end of the visit.  We discussed briefly some ways to move forward.  Strongly encouraged she establish with a counselor.  Discussed some other strategies to deal with stress and anxiety.  Allison Wagner was seen today for annual exam.  Diagnoses and all orders for this visit:  Encounter for health maintenance examination in adult -     Comprehensive metabolic panel with GFR -     CBC -     Lipid panel -     Hemoglobin A1c -     Cortisol -     TSH + free T4  BMI 40.0-44.9, adult (HCC)  Mild intermittent asthma without complication  Essential hypertension  Gastroesophageal reflux disease, unspecified whether esophagitis present  PCOS (polycystic ovarian syndrome) -     Cortisol -     TSH + free T4  Weight gain -     Cortisol -  TSH + free T4  Screening for lipid disorders -     Lipid panel  OSA (obstructive sleep apnea)  Need for HPV vaccination -     HPV 9-valent vaccine,Recombinat  Anxiety  Other orders -     hydrochlorothiazide  (HYDRODIURIL ) 12.5 MG tablet; Take 1 tablet (12.5 mg total) by mouth daily. -     esomeprazole  (NEXIUM ) 40 MG capsule; Take 1 capsule (40 mg total) by mouth daily. -     albuterol  (VENTOLIN  HFA) 108 (90 Base) MCG/ACT inhaler; Inhale 2 puffs into the lungs every 6 (six) hours as needed for wheezing or shortness of breath. -     amLODipine  (NORVASC ) 10 MG tablet; Take 1 tablet (10 mg total) by mouth daily.    F/u pending labs   [facility]    [1]  Allergies Allergen Reactions   Codeine Other (See Comments)    unsure   Hydrocodone Other (See Comments)    h/a   Influenza Vaccines    Labetalol      Somnolence and edema  [2]  Current Outpatient Medications on File Prior to Visit  Medication Sig Dispense Refill   L-THEANINE PO Take by mouth.      Magnesium  Hydroxide (MAGNESIA PO) Take by mouth.     Multiple Vitamins-Minerals (MULTIVITAMIN WITH MINERALS) tablet Take 1 tablet by mouth daily.     tretinoin (RETIN-A) 0.025 % cream Apply topically at bedtime.     Vitamin D -Vitamin K (VITAMIN K2-VITAMIN D3 PO) Take by mouth.     Multiple Vitamins-Minerals (MULTIVITAMIN ADULTS PO)      No current facility-administered medications on file prior to visit.  [3]  Current Outpatient Medications:    amLODipine  (NORVASC ) 10 MG tablet, Take 1 tablet (10 mg total) by mouth daily., Disp: 90 tablet, Rfl: 3   L-THEANINE PO, Take by mouth., Disp: , Rfl:    Magnesium  Hydroxide (MAGNESIA PO), Take by mouth., Disp: , Rfl:    Multiple Vitamins-Minerals (MULTIVITAMIN WITH MINERALS) tablet, Take 1 tablet by mouth daily., Disp: , Rfl:    tretinoin (RETIN-A) 0.025 % cream, Apply topically at bedtime., Disp: , Rfl:    Vitamin D -Vitamin K (VITAMIN K2-VITAMIN D3 PO), Take by mouth., Disp: , Rfl:    albuterol  (VENTOLIN  HFA) 108 (90 Base) MCG/ACT inhaler, Inhale 2 puffs into the lungs every 6 (six) hours as needed for wheezing or shortness of breath., Disp: 18 each, Rfl: 1   esomeprazole  (NEXIUM ) 40 MG capsule, Take 1 capsule (40 mg total) by mouth daily., Disp: 90 capsule, Rfl: 2   hydrochlorothiazide  (HYDRODIURIL ) 12.5 MG tablet, Take 1 tablet (12.5 mg total) by mouth daily., Disp: 90 tablet, Rfl: 3   Multiple Vitamins-Minerals (MULTIVITAMIN ADULTS PO), , Disp: , Rfl:   "

## 2024-09-17 ENCOUNTER — Ambulatory Visit: Payer: Self-pay | Admitting: Medical

## 2024-09-17 ENCOUNTER — Other Ambulatory Visit: Payer: Self-pay | Admitting: Medical

## 2024-09-17 LAB — COMPREHENSIVE METABOLIC PANEL WITH GFR
ALT: 15 [IU]/L (ref 0–32)
AST: 19 [IU]/L (ref 0–40)
Albumin: 4.5 g/dL (ref 3.9–4.9)
Alkaline Phosphatase: 106 [IU]/L (ref 41–116)
BUN/Creatinine Ratio: 14 (ref 9–23)
BUN: 12 mg/dL (ref 6–20)
Bilirubin Total: 0.2 mg/dL (ref 0.0–1.2)
CO2: 22 mmol/L (ref 20–29)
Calcium: 9.5 mg/dL (ref 8.7–10.2)
Chloride: 100 mmol/L (ref 96–106)
Creatinine, Ser: 0.85 mg/dL (ref 0.57–1.00)
Globulin, Total: 2.7 g/dL (ref 1.5–4.5)
Glucose: 106 mg/dL — ABNORMAL HIGH (ref 70–99)
Potassium: 4 mmol/L (ref 3.5–5.2)
Sodium: 138 mmol/L (ref 134–144)
Total Protein: 7.2 g/dL (ref 6.0–8.5)
eGFR: 89 mL/min/{1.73_m2}

## 2024-09-17 LAB — LIPID PANEL
Chol/HDL Ratio: 2.3 ratio (ref 0.0–4.4)
Cholesterol, Total: 108 mg/dL (ref 100–199)
HDL: 47 mg/dL
LDL Chol Calc (NIH): 41 mg/dL (ref 0–99)
Triglycerides: 110 mg/dL (ref 0–149)
VLDL Cholesterol Cal: 20 mg/dL (ref 5–40)

## 2024-09-17 LAB — CBC
Hematocrit: 32.6 % — ABNORMAL LOW (ref 34.0–46.6)
Hemoglobin: 9.9 g/dL — ABNORMAL LOW (ref 11.1–15.9)
MCH: 24 pg — ABNORMAL LOW (ref 26.6–33.0)
MCHC: 30.4 g/dL — ABNORMAL LOW (ref 31.5–35.7)
MCV: 79 fL (ref 79–97)
Platelets: 476 10*3/uL — ABNORMAL HIGH (ref 150–450)
RBC: 4.12 x10E6/uL (ref 3.77–5.28)
RDW: 14.1 % (ref 11.7–15.4)
WBC: 8.1 10*3/uL (ref 3.4–10.8)

## 2024-09-17 LAB — TSH+FREE T4
Free T4: 1.04 ng/dL (ref 0.82–1.77)
TSH: 3.56 u[IU]/mL (ref 0.450–4.500)

## 2024-09-17 LAB — CORTISOL: Cortisol: 11.9 ug/dL (ref 6.2–19.4)

## 2024-09-17 LAB — HEMOGLOBIN A1C
Est. average glucose Bld gHb Est-mCnc: 114 mg/dL
Hgb A1c MFr Bld: 5.6 % (ref 4.8–5.6)

## 2024-09-17 NOTE — Progress Notes (Signed)
 Results through MyChart

## 2025-09-20 ENCOUNTER — Encounter: Admitting: Medical
